# Patient Record
Sex: Male | Born: 1940 | Race: White | Hispanic: No | Smoking: Never smoker
Health system: Southern US, Community
[De-identification: ages and names within clinical notes are randomized; demographics above are authoritative.]

## PROBLEM LIST (undated history)

## (undated) DIAGNOSIS — C801 Malignant (primary) neoplasm, unspecified: Secondary | ICD-10-CM

## (undated) DIAGNOSIS — I25810 Atherosclerosis of coronary artery bypass graft(s) without angina pectoris: Secondary | ICD-10-CM

## (undated) DIAGNOSIS — E119 Type 2 diabetes mellitus without complications: Secondary | ICD-10-CM

## (undated) DIAGNOSIS — E7800 Pure hypercholesterolemia, unspecified: Secondary | ICD-10-CM

## (undated) DIAGNOSIS — I10 Essential (primary) hypertension: Secondary | ICD-10-CM

## (undated) HISTORY — PX: CHOLECYSTECTOMY: SHX55

## (undated) HISTORY — PX: COLON SURGERY: SHX602

## (undated) HISTORY — PX: CORONARY ARTERY BYPASS GRAFT: SHX141

## (undated) HISTORY — PX: VASECTOMY: SHX75

---

## 2001-03-26 ENCOUNTER — Ambulatory Visit (HOSPITAL_COMMUNITY): Admission: RE | Admit: 2001-03-26 | Discharge: 2001-03-27 | Payer: Self-pay | Admitting: Cardiology

## 2001-04-01 ENCOUNTER — Inpatient Hospital Stay (HOSPITAL_COMMUNITY): Admission: RE | Admit: 2001-04-01 | Discharge: 2001-04-07 | Payer: Self-pay | Admitting: Cardiothoracic Surgery

## 2001-04-01 ENCOUNTER — Encounter: Payer: Self-pay | Admitting: Cardiothoracic Surgery

## 2001-04-01 ENCOUNTER — Encounter (HOSPITAL_COMMUNITY): Admission: RE | Admit: 2001-04-01 | Discharge: 2001-05-13 | Payer: Self-pay | Admitting: Cardiology

## 2001-04-02 ENCOUNTER — Encounter: Payer: Self-pay | Admitting: Cardiothoracic Surgery

## 2001-04-03 ENCOUNTER — Encounter: Payer: Self-pay | Admitting: Cardiothoracic Surgery

## 2001-04-04 ENCOUNTER — Encounter: Payer: Self-pay | Admitting: Cardiothoracic Surgery

## 2001-05-15 ENCOUNTER — Encounter (HOSPITAL_COMMUNITY): Admission: RE | Admit: 2001-05-15 | Discharge: 2001-08-13 | Payer: Self-pay | Admitting: Cardiology

## 2003-08-02 ENCOUNTER — Encounter: Admission: RE | Admit: 2003-08-02 | Discharge: 2003-08-02 | Payer: Self-pay | Admitting: Family Medicine

## 2006-09-18 ENCOUNTER — Ambulatory Visit (HOSPITAL_COMMUNITY): Admission: RE | Admit: 2006-09-18 | Discharge: 2006-09-18 | Payer: Self-pay | Admitting: Orthopedic Surgery

## 2006-09-23 ENCOUNTER — Ambulatory Visit (HOSPITAL_BASED_OUTPATIENT_CLINIC_OR_DEPARTMENT_OTHER): Admission: RE | Admit: 2006-09-23 | Discharge: 2006-09-23 | Payer: Self-pay | Admitting: Orthopedic Surgery

## 2010-01-22 ENCOUNTER — Emergency Department (HOSPITAL_BASED_OUTPATIENT_CLINIC_OR_DEPARTMENT_OTHER): Admission: EM | Admit: 2010-01-22 | Discharge: 2010-01-22 | Payer: Self-pay | Admitting: Emergency Medicine

## 2010-12-08 ENCOUNTER — Emergency Department (INDEPENDENT_AMBULATORY_CARE_PROVIDER_SITE_OTHER): Payer: MEDICARE

## 2010-12-08 ENCOUNTER — Emergency Department (HOSPITAL_BASED_OUTPATIENT_CLINIC_OR_DEPARTMENT_OTHER)
Admission: EM | Admit: 2010-12-08 | Discharge: 2010-12-08 | Disposition: A | Discharge status: Home / Self Care | Payer: MEDICARE | Attending: Emergency Medicine | Admitting: Emergency Medicine

## 2010-12-08 DIAGNOSIS — Z951 Presence of aortocoronary bypass graft: Secondary | ICD-10-CM

## 2010-12-08 DIAGNOSIS — R071 Chest pain on breathing: Secondary | ICD-10-CM | POA: Insufficient documentation

## 2010-12-08 DIAGNOSIS — E669 Obesity, unspecified: Secondary | ICD-10-CM | POA: Insufficient documentation

## 2010-12-08 DIAGNOSIS — R0789 Other chest pain: Secondary | ICD-10-CM | POA: Insufficient documentation

## 2010-12-08 DIAGNOSIS — E119 Type 2 diabetes mellitus without complications: Secondary | ICD-10-CM | POA: Insufficient documentation

## 2010-12-08 DIAGNOSIS — E7800 Pure hypercholesterolemia, unspecified: Secondary | ICD-10-CM | POA: Insufficient documentation

## 2010-12-08 DIAGNOSIS — R079 Chest pain, unspecified: Secondary | ICD-10-CM

## 2010-12-08 DIAGNOSIS — I10 Essential (primary) hypertension: Secondary | ICD-10-CM | POA: Insufficient documentation

## 2010-12-08 DIAGNOSIS — Z79899 Other long term (current) drug therapy: Secondary | ICD-10-CM | POA: Insufficient documentation

## 2010-12-08 DIAGNOSIS — I2510 Atherosclerotic heart disease of native coronary artery without angina pectoris: Secondary | ICD-10-CM | POA: Insufficient documentation

## 2010-12-08 LAB — POCT CARDIAC MARKERS
CKMB, poc: <1 ng/mL — ABNORMAL LOW (ref 1.0–8.0)
Myoglobin, poc: 45.7 ng/mL (ref 12–200)

## 2011-01-05 NOTE — Op Note (Signed)
. [facility]  Patient:    Dwayne Brown, Dwayne Brown                       MRN: [id number] Proc. Date: 04/01/01 Adm. Date:  [id number] Attending:  Mikey Bussing CC:         Armanda Magic, M.D.   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  POSTOPERATIVE DIAGNOSIS:  Class 3 progressive angina with severe three-vessel coronary artery disease.  OPERATION PERFORMED:  Coronary artery bypass grafting x 5 (left internal mammary artery to left anterior descending, saphenous vein graft to diagonal, sequential saphenous vein graft to obtuse marginal 1 and obtuse marginal 2, saphenous vein graft to posterior descending coronary artery).  SURGEON:  Mikey Bussing, M.D.  ASSISTANT:  Maxwell Marion, RNFA  ANESTHESIA:  General.  INDICATIONS FOR PROCEDURE:  The patient is a 70 year old type 2 diabetic with obesity and hypertension who presented with new onset chest pain.  Cardiac catheterization by Dr. Mayford Knife demonstrated severe three-vessel coronary disease with total occlusion of the right coronary, 80% stenosis of the LAD diagonal and 80% stenosis of two circumflex marginals.  He was referred for surgical coronary revascularization.  Prior to the operation I examined the patient in his hospital room and reviewed the results of the cardiac catheterization with the patient and his wife.  I discussed the indications and the expected benefits of coronary artery bypass surgery with the patient and wife.  I discussed the major aspects of the operation including the choice of conduit, the location of the surgical incisions, use of general anesthesia and cardiopulmonary bypass and the expected hospital recovery. I reviewed the alternatives to surgical therapy for treatment of his coronary artery disease. I discussed with the patient and his wife the risks associated with the operation  including the risks of MI, CVA, bleeding, infection, and death.  He understood these implications for the operation and agreed to proceed with the procedure under what I felt was an informed consent.  OPERATIVE FINDINGS:  The patients body habitus made exposure of the aorta and back of the heart very difficult.  The ascending aorta was very foreshortened and there is a large amount of epicardial fat.  The coronaries are deeply imbedded in a deep layer of epicardial fat.  The LAD is intramyocardial in the interventricular septum as well.  The proximal right coronary was nongraftable.  The distal right coronary was small but graftable.  The patient would not be considered a candidate for redo surgery due to the body habitus and very poor exposure of the ascending aorta and the posterior aspect of the ventricle.  DESCRIPTION OF PROCEDURE:  The patient was brought to the operating room and placed supine on the operating table where general anesthesia was induced under invasive hemodynamic monitoring.  The chest, abdomen and legs were prepped with Betadine and draped as a sterile field.  A median sternotomy was performed as the saphenous vein was harvested from the right leg.  The internal mammary was harvested as a pedicle graft from its origin at the subclavian vessel and was a good vessel with excellent flow.  Heparin was administered and the ACT was documented as being therapeutic.  Through pursestrings placed in the ascending aorta and right atrium, the patient was cannulated and placed on bypass and cooled to 32 degrees.  The coronaries were identified and the mammary artery and vein grafts were  prepared for the distal anastomoses.  A cardioplegia cannula was placed in the ascending aorta and the patient was cooled to 28 degrees.  As the aortic crossclamp was applied, 650 cc of cold blood cardioplegia was delivered to the aortic root with immediate cardioplegic arrest and septal  temperature dropping to less than 12 degrees.  Topical iced saline slush was used to augment myocardial preservation and a pericardial insulator pad was used to protect the left phrenic nerve.  The distal coronary anastomoses were then performed.  The first distal anastomosis was to the posterior descending with total proximal occlusion. A reversed saphenous vein was sewn end-to-side with running 7-0 Prolene with a good flow through the graft.  The second distal anastomosis was to the first diagonal.  This had a proximal 60% stenosis.  A reversed saphenous vein was sewn end-to-side with 7-0 Prolene with good flow through the graft.  The third and fourth distal anastomoses consisted of a sequential vein to the OM1 and OM2.  Each was a 1.5 mm vessel with proximal 80% stenosis.  Each vessel was sclerotic and diffusely diseased with a diabetic pattern.  A side-to-side anastomosis with the vein to the OM1 was constructed with a running 7-0 Prolene.  An end-to-side anastomosis between the end of the vein and OM2 was constructed with a running 7-0 Prolene.  There was excellent flow through the graft and cardioplegia was redosed following completion of these two distal anastomoses.  The fifth distal anastomosis was to the distal third of the LAD where it became epicardial in location.  The left internal mammary artery pedicle was brought through an opening created in the left lateral pericardium and was brought down onto the LAD and was sewn end-to-side with running  8-0 Prolene.  There was good flow through the anastomosis with immediate rise in septal temperature  after release of the pedicle clamp on the mammary artery. The mammary pedicle was secured to the epicardium and the aortic crossclamp was removed.  The heart resumed a spontaneous rhythm.  Using a partial occlusion clamp and 4.0 mm punch, three proximal vein anastomoses were placed on the ascending aorta.  The partial clamp was  removed and the vein grafts were perfused.  Each had good flow and hemostasis was documented at the proximal and distal sites.  The patient was rewarmed to 37 degrees.  Temporary pacing wires  were applied. The patient was then weaned from bypass after the lungs re-expanded.  The ventilator was resumed.  The patient remained hemodynamically stable with good cardiac output and stable blood pressure.  Protamine was administered and the cannulas were removed.  The mediastinum was irrigated with warm antibiotic irrigation.  The leg incision was irrigated and closed in a standard fashion. The pericardium was loosely reapproximated over the aorta and vein grafts. Two mediastinal and a left pleural chest tube were placed and brought out through separate incisions.  The sternum was reapproximated with interrupted steel wire.  The pectoralis fascia was closed with interrupted #1 Vicryl.  The subcutaneous and skin were closed with running Vicryl and a sterile dressing was applied.  Total cardiopulmonary bypass time was 160 minutes with aortic crossclamp time of 70 minutes. DD:  04/01/01 TD:  04/01/01 Job: [id number]

## 2011-01-05 NOTE — Consult Note (Signed)
[place]. [facility]  Patient:    Dwayne Brown, Dwayne Brown                       MRN: [id number] Proc. Date: 03/26/01 Adm. Date:  [id number] Attending:  Armanda Magic CC:         [facility]  Marinda Elk, M.D.   Consultation Report  REASON FOR CONSULTATION:  Severe coronary artery disease with Class III angina.  PHYSICAL REQUESTING CONSULTATION:  Dr. Armanda Magic.  PRIMARY CARE PHYSICIAN:  Dr. Marinda Elk.  CHIEF COMPLAINT:  Chest pain.  HISTORY OF PRESENT ILLNESS:  I was asked to see Mr. Dwayne Brown in consultation during this hospitalization by Dr. Mayford Knife for evaluation of recently diagnosed severe coronary artery disease.  The patient is a 70 year old, obese, male diabetic with a history of hypertension and hyperlipidemia who had experienced exertional chest pain for the past two to three weeks.  This occurs in the epigastric area and radiates to the left upper chest and left shoulder.  The pain resolves with rest and has been repeated during exertion over the recent few weeks.  The patient denies any rest angina or associated diaphoresis, nausea, vomiting or jaw pain.  There is no syncope.  The patient was referred to Dr. Mayford Knife for evaluation of this chest pain and was examined by Dr. Mayford Knife on August 6.  He was scheduled for outpatient cardiac catheterization today which was completed by Dr. Mayford Knife and revealed normal LV function with left ventricular end-diastolic pressure of 12 mmHg.  The LAD had a mid 50% stenosis.  The first and second obtuse marginal vessels had a 70-80% stenosis.  The right coronary had a chronic occlusion with reconstitution of the distal right via collaterals from the left.  The patient was not felt to be a good candidate for angioplasty due to his long diabetic lesions and he was referred for surgical coronary revascularization.  The patient is currently in the catheterization lab holding unit and comfortable without  chest pain.  PAST MEDICAL HISTORY: 1. Hypertension. 2. Type 2 diabetes mellitus. 3. Gout. 4. Hyperlipidemia.  PAST SURGICAL HISTORY: 1. Cholecystectomy. 2. Vasectomy.  MEDICATIONS: 1. Amaryl 1 mg a day. 2. Univasc 15 mg q.d. 3. Allopurinol 300 mg a day. 4. Lopid 600 mg b.i.d. 5. Glucophage 850 mg b.i.d. 6. Avandia 4 mg a day. 7. Indocin p.r.n. gout pain.  ALLERGIES:  No known drug allergies.  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  He use to smoke, but stopped in 1965.  He occasionally uses alcohol, but this is minimal.  FAMILY HISTORY:  The patients father died at age 3 of heart failure.  His mother has diabetes at age 4.  He has one brother, age 94, who had coronary artery bypass graft surgery in his 67s.  REVIEW OF SYSTEMS:  The patients current weight of 270 pounds is stable.  He is right hand dominant.  He denies any thoracic trauma or rib fractures in the past.  He denies any bleeding diaphysis or easy bruisability.  He denies any other circulatory problems including TIA, CVA, DVT, phlebitis or claudication. He denies any problems with prior general anesthesia.  He has had no problems with hepatitis, jaundice, ulcer disease or blood per rectum.  He denies any history of prostatitis, nocturia, hematuria or urinary infection.  He denies any pulmonary problems to shortness of breath, hemoptysis, bronchitis or asthma.  Review of  systems is otherwise negative.  PHYSICAL EXAMINATION:  VITAL SIGNS:  Height 5 feet 10 inches, weight 270 pounds.  Blood pressure 110/70, heart rate 60 per minute, respiratory rate 18 and unlabored.  GENERAL:  This is a pleasant, obese, middle-aged, white male in the catheterization holding unit in no distress.  HEENT:  Normocephalic.  EOMs normal.  Pink conjunctivae.  Pharynx is clear. Dentition under adequate repair.  NECK:  Supple without JVD, thyromegaly, carotid bruit or  mass.  LYMPHATICS:  Reveal no palpable adenopathy in the cervical, supraclavicular or axillary regions.  Thoracic exam was without deformity.  LUNGS:  Clear to auscultation bilaterally.  CARDIAC:  Regular rate and rhythm without S3 gallop or murmur.  EXTREMITIES:  Good range of motion without swollen tender joints and without clubbing.  There is no significant pedal edema.  ABDOMEN:  Soft, nontender without pulsatile mass or abdominal bruit.  His right groin has a compression dressing following cardiac catheterization.  RECTAL/GU:  Deferred.  SKIN:  Clear, warm and dry without lesion or rash.  EXTREMITIES:  2+ pulses in the radial, femoral and pedal areas bilaterally. There is no venous insufficiency of the lower extremities.  NEUROLOGIC:  Alert and oriented x 3 with full motor function.  LABORATORY DATA AND X-RAY FINDINGS:  I reviewed the coronary arteriograms and agree with Dr. Malachy Mood impression and recommendation for surgical revascularization.  His vascular lab studies show no significant carotid artery disease.  His brachial pressures are equal bilaterally and the palmar arch is incomplete in both upper extremities.  ABIs are greater than 1.0 in both lower extremities.  His precatheterization chest x-ray shows no active disease.  His 12-lead EKG shows sinus bradycardia, otherwise normal.  His precatheterization creatinine was normal at 1.2.  His hematocrit is 34, platelet count 228,000.  His slight precatheterization anemia is unexplained.  IMPRESSION/PLAN:  Surgical coronary vascularization would benefit this obese diabetes with respect to preservation of his ventricular function and improved symptoms and survival.  I discussed the procedure with the patient and his wife including the major aspects of the surgical procedure including location  of the incisions, use of general anesthesia and cardiopulmonary bypass, choice of conduit and the expected hospital recovery.  I  reviewed the alternatives to surgical therapy for his coronary artery disease.  We discussed the risks associated with this operation to the patient including risks of MI, CVA, bleeding, infection and death.  He understands these implications for the surgery and agrees to proceed with the operation.  We will schedule the procedure on August 13, for early a.m.DD:  03/26/01 TD:  03/26/01 Job: [id number]

## 2011-01-05 NOTE — Op Note (Signed)
NAMEKENON, DELASHMIT              ACCOUNT NO.:  [id number]   MEDICAL RECORD NO.:  [id number]          PATIENT TYPE:  AMB   LOCATION:  DSC                          FACILITY:  [facility]   PHYSICIAN:  Harvie Junior, M.D.   DATE OF BIRTH:  11/30/1940   DATE OF PROCEDURE:  09/23/2006  DATE OF DISCHARGE:                               OPERATIVE REPORT   PREOPERATIVE DIAGNOSIS:  Medial meniscal tear.   POSTOPERATIVE DIAGNOSES:  1. 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  2. Medial shelf plica.  3. Chondromalacia of the patellofemoral trochlea.  4. The patient has a subacromial impingement, left side.   PROCEDURES PERFORMED:  1. Partial posterior horn medial meniscectomy with corresponding      debridement in the medial compartment.  2. Debridement of patellofemoral trochlea by way of chondroplasty.  3. Debridement of medial shelf plica.  4. Subacromial injection with 6 mL of 0.5% Marcaine with 2 mL of 80 mg      per mL of Depo-Medrol.   SURGEON:  Harvie Junior, M.D.   Threasa HeadsOrma Flaming.   ANESTHESIA:  General.   INDICATIONS FOR PROCEDURE:  Mr. Skelley is a 70 year old male with a  long history of having had effusion and pain in the knee.  He had a  history of gout.  He was treated presumptively with this diagnosis and  really did not get much better.  Because of continued complaints of  pain, he was ultimately evaluated in the office.  I felt him to have a  medial meniscal tear; MRI confirmed the diagnosis.  We had a long talk  about treatment options and ultimately felt that arthroscopic  intervention would be the most appropriate course of action.  He was  brought to the operating room for the procedure.  The patient began  having significant pain in the left shoulder 3 days prior to surgery.  He was having difficulty raising the arm up over head.  Examination  showed a good external rotational strength.  He had significant  primary  and secondary impingement findings, no significant tenderness to  palpation over the [facility] joint.  We talked about the possibility of  injection perioperatively.  He did wish to proceed with this, and the  left shoulder was injected subacromially with 6 of Marcaine and 2 mL of  Depo-Medrol to give him good postoperative relief in the shoulder.  A  Band-Aid was placed once this was accomplished.   DESCRIPTION OF PROCEDURE:  The patient was brought to the operating  room.  After adequate general anesthesia was obtained with a general  anesthetic, the patient was placed on the operating table, where the  left leg was prepped and draped in the usual sterile fashion.  Following  this, routine arthroscopic examination of the knee revealed that there  was obvious posterior horn medial meniscal tear.  This was debrided with  a straight-biting forceps and up-biting forceps and the meniscal rim was  counted down with the suction shaver to a smooth and stable rim.  There  was  a free flap which was flapped posteriorly and medially, and would  flap in and out of the knee.  There also was a flap medially, which was  under the medial border of the meniscus.  Once this was completely  debrided, the remaining meniscal rim was stable and a probe could be  used to ensure that there was still 4 mm of meniscal rim posteriorly.  Attention was then turned to the medial femoral condyle.  It had some  grade 2 changes, no deep fragments or cracks.  Attention was then turned  to the ACL, normal, lateral side normal.  Attention was then turned up  to the patellofemoral joint, which had the significant chondromalacia in  the patellofemoral trochlea, which was debrided.  This was debrided from  both the medial and lateral compartments by way of chondroplasty.  Attention was turned down to that medial compartment and the large  medial shelf plica, which was blocking this initially, was now debrided  with a  suction shaver back to the smooth and stable rim.  Once this was  completed, 6 liters of normal saline irrigation was instilled in the  knee to wash the knee out thoroughly, and at this point, the knee was  copiously irrigated and suctioned dry.  The arthroscopic portals were  closed with a bandage.  A sterile compressive dressing was applied, and  the patient taken to the recovery room where he was noted to be in  satisfactory condition.   ESTIMATED BLOOD LOSS:  None.      Harvie Junior, M.D.  Electronically Signed     JLG/MEDQ  D:  09/23/2006  T:  09/24/2006  Job:  [id number]   cc:   Harvie Junior, M.D.

## 2011-01-05 NOTE — Cardiovascular Report (Signed)
[place]. [facility]  Patient:    Dwayne Brown, Dwayne Brown                       MRN: [id number] Proc. Date: 03/26/01 Adm. Date:  [id number] Attending:  Armanda Magic CC:         Marinda Elk, M.D.   Cardiac Catheterization  REFERRING PHYSICIAN:  Marinda Elk, M.D.  PROCEDURE:  Left heart catheterization, coronary angiography, and left ventriculography.  OPERATOR:  Armanda Magic, M.D.  INDICATIONS:   Chest pain.  IV ACCESS:  Via right femoral artery, 6-French sheath.  COMPLICATIONS:  None.  HISTORY:  The patient is a 70 year old white male with a history of hyperlipidemia, hypertension, non-insulin-dependent diabetes mellitus who presented with chest pain.  He has a strong family history of coronary disease, and he now presents for cardiac catheterization.  DESCRIPTION OF PROCEDURE:  The patient is brought to the cardiac catheterization laboratory in a fasting nonsedated state.  Informed consent was obtained.  The patient was connected to continuous heart rate and pulse oximetry monitoring and intermittent blood pressure monitoring.  The right groin was prepped and draped in a sterile fashion.  Xylocaine, 1%, was used for local anesthesia.  Using the modified Seldinger technique, a 6-French sheath was placed in the right femoral artery.  Under fluoroscopic guidance, a 6-French JL4 catheter was placed in the left coronary artery.  Multiple cine films were taken in the RAO 30-degree, LAO 40-degree views.  This catheter was then exchanged out over a guidewire for a 6-French JR4 catheter which was placed under fluoroscopic guidance into the right coronary artery.  Multiple cine films were taken in the 30-degree RAO, 40-degree LAO views.  This catheter was then exchanged out over a guidewire for a 6-French angled pigtail catheter which was placed in the left ventricular cavity.  Left ventriculography was performed in the 30-degree RAO view using a total of 30 cc  of contrast at 13 cc per second.  The catheter was then removed over a guidewire.  At the end of the procedure, all catheters and sheaths were removed.  Manual compression was performed until adequate hemostasis was obtained.  The patient was transferred back to the room in stable condition.  RESULTS: 1. The left main coronary artery is widely patent throughout the course and    bifurcates into a left anterior descending artery, a very small ramus    branch, and a left circumflex artery. 2. The left anterior descending artery is heavily calcified proximally with    luminal irregularities.  It gives off a first diagonal branch which has a    30-40% ostial narrowing.  The mid LAD has a 40-50% narrowing and then is    widely patent throughout the rest of its course giving off a second    diagonal which is patent. 3. The ramus is a very small branch but is patent. 4. The left circumflex has a 30% ostial lesion and then is patent throughout    its course.  It gives rise to two obtuse marginal branches sequentially    next to each other.  The first obtuse marginal branch has a 60-70%    narrowing proximally and then bifurcates distally into two daughter    branches.  The second obtuse marginal has a long narrowing at least 60-70%    narrowing and then branches into two daughter branches.  There is    left-to-left filling from the left circumflex system to the distal  right    coronary artery. 5. The right coronary artery is diffusely diseased up to 80% throughout the    proximal and mid portion of the body and then occluded distally.  There is    a very large RV marginal branch that is widely patent.  Left ventriculography performed in the 30-degree RAO view using a total of 30 cc of contrast at 13 cc per second showed normal left ventricular function. Left ventricular pressure was 107/12 mmHg.  Aortic pressure was 104/67 mmHg.  ASSESSMENT: 1. Two-vessel obstructive coronary disease. 2.  Normal left ventricular function. 3. Diabetes mellitus. 4. Hyperlipidemia.  PLAN:  CVTS consult. DD:  03/26/01 TD:  03/26/01 Job: [id number]

## 2011-01-05 NOTE — Discharge Summary (Signed)
[place]  Patient:    YUSUF, YU Visit Number: [id number] MRN: [id number]          Service Type: SUR Location: 2000 2005 01 Attending Physician:  Mikey Bussing Dictated by:   Maxwell Marion, RNFA Adm. Date:  04/01/2001 Disc. Date: 04/07/2001   CC:         Marinda Elk, M.D.  Armanda Magic, M.D.   Discharge Summary  DATE OF BIRTH:  07/31/1941  DATE OF SURGERY:  April 01, 2001  ADMISSION DIAGNOSIS:  Three-vessel coronary artery disease and class III progressive angina.  PAST MEDICAL HISTORY: 1. Hypertension. 2. Diabetes mellitus type 2. 3. Gout. 4. Hypercholesterolemia.  PAST SURGICAL HISTORY: 1. Cholecystectomy. 2. Vasectomy.  ALLERGIES:  No known drug allergies.  DISCHARGE DIAGNOSES: 1. Three-vessel coronary artery disease and class III angina, status post    coronary artery bypass grafting. 2. Postoperative anemia, resolving.  HISTORY OF PRESENT ILLNESS:  Mr. Dwayne Brown is a 70 year old white man who presented initially with a two- to three-week history of exertional chest pain.  His pain was epigastric in location and radiated to his left chest and shoulder.  It was relieved with rest.  No nausea, vomiting, diaphoresis associated with the pain.  She saw Dr. Mayford Knife in her office on March 25, 2001, and she recommended cardiac catheterization, which was performed on March 26, 2001.  This revealed normal LV function and two-vessel coronary artery disease.  Because of his coronary anatomy, he was not a candidate for angioplasty.  Cardiac surgery consult was obtained with Dr. Kathlee Nations Trigt. After examination of the patient and review of the records, he recommended coronary artery bypass grafting as preferred treatment choice for this gentleman.  This was scheduled for April 01, 2001.  Doppler studies were also performed, which revealed no significant coronary artery disease, and he was noted to have palpable  pedal pulses bilaterally.  Mr. Hammerschmidt left the hospital on a leave of absence until the morning of his surgery.  HOSPITAL COURSE:  On April 01, 2001, Mr. Aird was electively admitted to [facility] by Dr. Kathlee Nations Trigt and underwent uncomplicated coronary artery bypass grafting x 5.  The grafts placed at the time of procedure were left internal mammary artery to the left anterior descending artery, saphenous vein was grafted to the diagonal artery, saphenous vein was grafted to the posterior descending artery, saphenous vein was grafted in a sequential fashion to the first obtuse marginal and the second obtuse marginal.  A vein was harvested from the right leg for bypass graft.  At the conclusion of the procedure he was transferred in stable condition to the SICU.  Mr. Rybicki postoperative course has been notable for anemia, requiring transfusion.  On April 03, 2001, his hemoglobin was 6.9, hematocrit was 19.6.  He received two units of packed red blood cells.  His anemia is resolving.  Repeat hemoglobin and hematocrit studies on April 04, 2001, revealed hemoglobin 8.2, hematocrit 23.5.  Again, on April 06, 2001, hemoglobin 8.5 and hematocrit 24.8.  He was also started on oral iron supplementation.  Mr. Bembenek is making very good progress with recovery from his surgery, and he was discharged home this morning, April 07, 2001.  CONDITION ON DISCHARGE:  Improved.  DISCHARGE INSTRUCTIONS:  Include medications, activity, diet, wound care, and follow-up appointments.  Please see discharge instruction sheet for details.  DISCHARGE MEDICATIONS: 1. Enteric-coated aspirin 325 mg p.o. q.d. 2. Tylox 1-2 p.o. q.4-6h.  p.r.n. for pain. 3. Toprol XL 25 mg p.o. q.d. 4. Niferex 150 mg p.o. q.d. 5. Colace 200 mg p.o. q.d. 6. Lasix 40 mg p.o. q.d. x 1 week. 7. Potassium chloride 20 mEq p.o. q.d. x 1 week. 8. He has also been instructed to resume his home medications of:    a.  Avandia 4 mg p.o. q.d.    b. Amaryl 0.5 mg p.o. q.d.    c. Glucophage 850 mg p.o. q.d.    d. Allopurinol 300 mg p.o. q.d.  FOLLOW-UP:  Mr. Maulden has an appointment at the [facility] office on Friday, April 11, 2001, at 10 a.m. for staple removal.  He also has an appointment to see Dr. Mayford Knife at her office on Tuesday, April 22, 2001.  He will have a chest x-ray taken at 10:45 and see Dr. Mayford Knife at 11:30.  His last appointment is with Dr. Donata Clay on Friday, April 25, 2001, at 10:30 in the morning at the [facility] office. Dictated by:   Maxwell Marion, RNFA Attending Physician:  Mikey Bussing DD:  04/07/01 TD:  04/08/01 Job: [phone] [id number]

## 2011-10-12 ENCOUNTER — Emergency Department (INDEPENDENT_AMBULATORY_CARE_PROVIDER_SITE_OTHER): Payer: Medicare Other

## 2011-10-12 ENCOUNTER — Emergency Department (HOSPITAL_BASED_OUTPATIENT_CLINIC_OR_DEPARTMENT_OTHER)
Admission: EM | Admit: 2011-10-12 | Discharge: 2011-10-12 | Disposition: A | Discharge status: Home / Self Care | Payer: Medicare Other | Attending: Emergency Medicine | Admitting: Emergency Medicine

## 2011-10-12 ENCOUNTER — Other Ambulatory Visit: Payer: Self-pay

## 2011-10-12 ENCOUNTER — Encounter (HOSPITAL_BASED_OUTPATIENT_CLINIC_OR_DEPARTMENT_OTHER): Payer: Self-pay

## 2011-10-12 DIAGNOSIS — Z951 Presence of aortocoronary bypass graft: Secondary | ICD-10-CM | POA: Insufficient documentation

## 2011-10-12 DIAGNOSIS — I2510 Atherosclerotic heart disease of native coronary artery without angina pectoris: Secondary | ICD-10-CM | POA: Insufficient documentation

## 2011-10-12 DIAGNOSIS — R1013 Epigastric pain: Secondary | ICD-10-CM | POA: Insufficient documentation

## 2011-10-12 DIAGNOSIS — I517 Cardiomegaly: Secondary | ICD-10-CM

## 2011-10-12 DIAGNOSIS — Z7982 Long term (current) use of aspirin: Secondary | ICD-10-CM | POA: Insufficient documentation

## 2011-10-12 DIAGNOSIS — R0789 Other chest pain: Secondary | ICD-10-CM

## 2011-10-12 DIAGNOSIS — I459 Conduction disorder, unspecified: Secondary | ICD-10-CM | POA: Insufficient documentation

## 2011-10-12 DIAGNOSIS — R079 Chest pain, unspecified: Secondary | ICD-10-CM

## 2011-10-12 DIAGNOSIS — I4510 Unspecified right bundle-branch block: Secondary | ICD-10-CM | POA: Insufficient documentation

## 2011-10-12 DIAGNOSIS — Z9889 Other specified postprocedural states: Secondary | ICD-10-CM | POA: Insufficient documentation

## 2011-10-12 HISTORY — DX: Pure hypercholesterolemia, unspecified: E78.00

## 2011-10-12 LAB — DIFFERENTIAL
Basophils Relative: 1 % (ref 0–1)
Eosinophils Absolute: 0.1 10*3/uL (ref 0.0–0.7)
Monocytes Relative: 8 % (ref 3–12)
Neutrophils Relative %: 51 % (ref 43–77)

## 2011-10-12 LAB — BASIC METABOLIC PANEL
BUN: 17 mg/dL (ref 6–23)
Creatinine, Ser: 1.2 mg/dL (ref 0.50–1.35)
GFR calc Af Amer: 69 mL/min — ABNORMAL LOW (ref >90)
GFR calc non Af Amer: 60 mL/min — ABNORMAL LOW (ref >90)
Potassium: 4.3 mEq/L (ref 3.5–5.1)

## 2011-10-12 LAB — CBC
Hemoglobin: 13.3 g/dL (ref 13.0–17.0)
MCH: 31.5 pg (ref 26.0–34.0)
MCHC: 33.9 g/dL (ref 30.0–36.0)
RDW: 14.1 % (ref 11.5–15.5)

## 2011-10-12 NOTE — ED Notes (Signed)
C/o mid chest/epigastric pain-intermittent-advil relieves pain-was sent from [place] in [place] with EKG

## 2011-10-12 NOTE — ED Provider Notes (Addendum)
History     CSN: [id number]  Arrival date & time 10/12/11  1404   First MD Initiated Contact with Patient 10/12/11 1421      Chief Complaint  Patient presents with  . Chest Pain    (Consider location/radiation/quality/duration/timing/severity/associated sxs/prior treatment) Patient is a 71 y.o. male presenting with chest pain. The history is provided by the patient.  Chest Pain The chest pain began 5 - 7 days ago (Patient states the pain is in the lower sternum and epigastric region). Duration of episode(s) is 1 hour. Chest pain occurs intermittently. The chest pain is resolved. Associated with: Nothing. At its most intense, the pain is at 5/10. The pain is currently at 0/10. The severity of the pain is moderate. The quality of the pain is described as pressure-like. The pain does not radiate. Exacerbated by: Is not worsened by breathing, eating, exertion or positions. Primary symptoms include abdominal pain. Pertinent negatives for primary symptoms include no fever, no shortness of breath, no cough, no wheezing, no palpitations, no nausea and no vomiting.  Pertinent negatives for associated symptoms include no diaphoresis, no lower extremity edema, no numbness and no weakness. He tried NSAIDs (Pain relieved with Advil) for the symptoms. Risk factors include being elderly and male gender.  His past medical history is significant for CAD, diabetes, hyperlipidemia and hypertension.  Pertinent negatives for past medical history include no DVT.     Past Medical History  Diagnosis Date  . Hypertension   . High cholesterol     Past Surgical History  Procedure Date  . Cholecystectomy   . Coronary artery bypass graft   . Vasectomy     No family history on file.  History  Substance Use Topics  . Smoking status: Never Smoker   . Smokeless tobacco: Not on file  . Alcohol Use: Yes      Review of Systems  Constitutional: Negative for fever and diaphoresis.  Respiratory: Negative  for cough, shortness of breath and wheezing.   Cardiovascular: Positive for chest pain. Negative for palpitations.  Gastrointestinal: Positive for abdominal pain. Negative for nausea and vomiting.  Neurological: Negative for weakness and numbness.  All other systems reviewed and are negative.    Allergies  Review of patient's allergies indicates no known allergies.  Home Medications  No current outpatient prescriptions on file.  BP 213/73  Pulse 65  Temp(Src) 98.2 F (36.8 C) (Oral)  Resp 20  Ht 5\' 11"  (1.803 m)  Wt 265 lb (120.203 kg)  BMI 36.96 kg/m2  SpO2 100%  Physical Exam  Nursing note and vitals reviewed. Constitutional: He is oriented to person, place, and time. He appears well-developed and well-nourished. No distress.  HENT:  Head: Normocephalic and atraumatic.  Mouth/Throat: Oropharynx is clear and moist.  Eyes: Conjunctivae and EOM are normal. Pupils are equal, round, and reactive to light.  Neck: Normal range of motion. Neck supple.  Cardiovascular: Normal rate, regular rhythm and intact distal pulses.   No murmur heard. Pulmonary/Chest: Effort normal and breath sounds normal. No respiratory distress. He has no wheezes. He has no rales. He exhibits no tenderness.  Abdominal: Soft. He exhibits no distension. There is no tenderness. There is no rebound and no guarding.  Musculoskeletal: Normal range of motion. He exhibits no edema and no tenderness.  Neurological: He is alert and oriented to person, place, and time.  Skin: Skin is warm and dry. No rash noted. No erythema.  Psychiatric: He has a normal mood and affect. His  behavior is normal.    ED Course  Procedures (including critical care time)  Labs Reviewed  BASIC METABOLIC PANEL - Abnormal; Notable for the following:    Glucose, Bld 105 (*)    GFR calc non Af Amer 60 (*)    GFR calc Af Amer 69 (*)    All other components within normal limits  CBC  DIFFERENTIAL  CARDIAC PANEL(CRET  KIN+CKTOT+MB+TROPI)   Dg Chest 2 View  10/12/2011  *RADIOLOGY REPORT*  Clinical Data: Chest pain  CHEST - 2 VIEW  Comparison: Chest x-ray of 12/08/2010  Findings: The lungs are clear.  Cardiomegaly is stable.  Median sternotomy sutures are noted from prior CABG.  IMPRESSION: Stable cardiomegaly.  No active lung disease.  Original Report Authenticated By: Juline Patch, M.D.     Date: 10/12/2011  Rate: 60  Rhythm: normal sinus rhythm  QRS Axis: normal  Intervals: normal  ST/T Wave abnormalities: nonspecific ST changes  Conduction Disutrbances:nonspecific intraventricular conduction delay  Narrative Interpretation:   Old EKG Reviewed: unchanged    1. Chest pain, atypical       MDM   Patient with intermittent chest pain for the last 5-6 days. He states he takes ibuprofen and the pain goes away. Area where he is pointing right at the pain is located in his lower sternum and epigastric region. He has no shortness of breath, nausea, vomiting, fever, cough diaphoresis with this pain. He states he had a CABG in the past and at that time he was having chest pain but it doesn't feel anything like this pain. States pain seems to be worse in the evenings and sometimes when he wakes up in the middle of the night.  Symptoms not consistent with PE. They are nonexertional and seemed less likely to be cardiac in origin however given his past medical history CBC, BMP, cardiac enzymes, chest x-ray pending. EKG unchanged with an incomplete right bundle branch block. Patient is well-appearing in chest pain free here. TIMI 2 for known coronary disease and aspirin daily. He denies having any pain today.  No infectious symptoms and states he does have GERD but this does not feel like reflux.   Symptoms are unlikely to be cardiac in origin based on history and negative cardiac enzymes, EKG, CBC, BMP and chest x-ray. however due to his past medical history spoke with Armanda Magic patient will followup as an  outpatient in the office for further evaluation. Patient has not had pain in the last 12 hours and feel that he would have elevated enzymes if there was a cardiac event last night.  Findings and plan discussed with patient and he is agreeable.  Gwyneth Sprout, MD 10/12/11 1527  Gwyneth Sprout, MD 10/12/11 1539

## 2011-10-12 NOTE — Discharge Instructions (Signed)
Chest Pain (Nonspecific) Chest pain has many causes. Your pain could be caused by something serious, such as a heart attack or a blood clot in the lungs. It could also be caused by something less serious, such as a chest bruise or a virus. Follow up with your doctor. More lab tests or other studies may be needed to find the cause of your pain. Most of the time, nonspecific chest pain will improve within 2 to 3 days of rest and mild pain medicine. HOME CARE  For chest bruises, you may put ice on the sore area for 15 to 20 minutes, 3 to 4 times a day. Do this only if it makes you or your child feel better.   Put ice in a plastic bag.   Place a towel between the skin and the bag.   Rest for the next 2 to 3 days.   Go back to work if the pain improves.   See your doctor if the pain lasts longer than 1 to 2 weeks.   Only take medicine as told by your doctor.   Quit smoking if you smoke.  GET HELP RIGHT AWAY IF:   There is more pain or pain that spreads to the arm, neck, jaw, back, or belly (abdomen).   You or your child has shortness of breath.   You or your child coughs more than usual or coughs up blood.   You or your child has very bad back or belly pain, feels sick to his or her stomach (nauseous), or throws up (vomits).   You or your child has very bad weakness.   You or your child passes out (faints).   You or your child has a temperature by mouth above 102 F (38.9 C), not controlled by medicine.  MAKE SURE YOU:   Understand these instructions.   Will watch this condition.   Will get help right away if you or your child is not doing well or gets worse.  Document Released: 01/23/2008 Document Revised: 04/18/2011 Document Reviewed: 01/23/2008 [facility] Patient Information 2012 [place], [place].Cardiac Biomarkers Cardiac biomarkers are enzymes, proteins, and hormones that are associated with heart function, damage or failure. Some of the tests are specific for the heart while  others are also elevated with skeletal muscle damage. Cardiac biomarkers are used for diagnostic and prognostic purposes and are frequently ordered by caregivers when someone comes into the Emergency Room complaining of symptoms, such as chest pain, pressure, nausea, and shortness of breath. These tests are ordered, along with other laboratory and non-laboratory tests, to detect heart failure (which is often a chronic, progressive condition affecting the ability of the heart to fill with blood and pump efficiently) and the acute coronary syndromes (ACS) as well as to help determine prognosis for people who have had a heart attack. ACS is a group of symptoms that reflect a sudden decrease in the amount of blood and oxygen, also termed 'ischemia,' reaching the heart. This decrease is frequently due to either a narrowing of the coronary arteries (atherosclerosis or vessel spasm) or unstable plaques, which can cause a blood clot (thrombus) and blockage of blood flow. If the oxygen supply is low, it can cause angina (pain); if blood flow is reduced, it can cause death of heart cells (called myocardial infarction or heart attack) and can lead to death of the affected heart muscle cells and to permanent damage and scarring of the heart.  The goal with cardiac biomarkers is to be able to  detect the presence and severity of an acute heart condition as soon as possible so that appropriate treatment can be initiated.  There are only a few cardiac biomarkers that are being routinely used by physicians. Some have been phased out because they are not as specific as the marker of choice - troponin. Many other potential cardiac biomarkers are still being researched but their clinical utility has yet to be established.  Note: Cardiac biomarkers are not the same tests as those that are used to screen the general healthy population for their risk of developing heart disease. Those can be found under Cardiac Risk  Assessment. LABORATORY TESTS CURRENT CARDIAC BIOMARKERS   CK and CK-MB   BNP or (NT-proBNP)   Troponin   Myoglobin (not always used; sometimes ordered with troponin)  MORE GENERAL TESTS FREQUENTLY ORDERED ALONG WITH CARDIAC BIOMARKERS   Blood Gases   CMP   BMP   Electrolytes   CBC  ON THE HORIZON Ischemia modified albumin (IMA) - Test has received FDA approval for use with troponin and electrocardiogram to rule out acute coronary syndrome (ACS) in patients with chest pain. May become useful for identifying patients at higher risk of heart attack and potentially could replace myoglobin one day.  NON-LABORATORY TESTS These tests allow caregivers to look at the size, shape, and function of the heart as it is beating. They can be used to detect changes to the rhythm of the heart as well as to detect and evaluate damaged tissues and blocked arteries.   EKG (ECG, electrocardiogram)   Coronary angiography (or arteriography)   Stress testing   Nuclear scan   ECG (echocardiogram)   Chest X-ray  THE FOLLOWING SUMMARIZES CURRENTLY USED CARDIAC BIOMARKERS. Marker: CK  What: Enzyme that exists in three different isoforms   Where Found: Heart, brain, and skeletal muscle   What Indicates: Injury to muscle cells   Time to Increase: 4 to 6 hours after injury, peaks in 18 to 24 hours   Time back to Normal: Normal in 48 to 72 hours, unless due to continuing injury   When/How Used: Being phased out, may be ordered prior to CK-MB  Marker: CK-MB  What: Heart- related portion of total CK enzyme   Where Found: Heart primarily, but also in skeletal muscle   What Indicates: Injury (cell death) to heart   Time to Increase: 4 to 6 hrs after heart attack, peaks in 12 to 20 hours   Time back to Normal: Returns to normal in 24 to 48 hours unless new/continual damage   When/How Used: Not as specific as Troponin for heart injury/attack, may be ordered when Troponin is not available, may  be ordered to monitor new/continuing damage  Marker: Myoglobin  What: Small oxygen-storing protein   Where Found: Heart and other muscle cells   What Indicates: Injury to heart or other muscle cells. Also elevated with kidney problems.   Time to Increase: Starts to rise within 2 to 3 hours, peaks in 8 to 12 hours.   Time back to Normal: Falls back to normal by about one day after injury occurred   When/How Used: Ordered along with Troponin, helps diagnose heart injury/attack  Marker: Cardiac Troponin  What: Components of a Regulatory protein complex. Two cardiac specific isoforms: T and I   Where Found: Heart muscle   What Indicates: Heart injury/damage   Time to Increase: 4 to 8 hours   Time back to Normal: Remains elevated for 7 to 14 days  When/How Used: Ordered to help assess prognosis and diagnose heart attack  Marker: LDH  What: Enzyme   Where Found: Almost all body tissues   What Indicates: General marker of injury to cells   When/How Used: Phased out, not specific  Marker: AST  What: Enzyme   Where Found: Almost all body tissues   What Indicates: General marker of injury to cells   When/How Used: Phased out, not specific  Marker: Hs-CRP  What: Protein   Where Found: Associated with athero-sclerosis   What Indicates: Inflammatory process   Time back to Normal: Elevated with inflammation   When/How Used: May help determine prognosis of patients who have had heart attack  Marker: BNP  What: Hormone   Where Found: Heart's left ventricle   What Indicates: Heart failure   Time back to Normal: Elevation related to severity   When/How Used: Help diagnose and evaluate heart failure, prognosis, and to monitor therapy  Document Released: 08/29/2004 Document Revised: 04/18/2011 Document Reviewed: 05/16/2005 [facility] Patient Information 2012 [place], [place].

## 2011-12-19 ENCOUNTER — Encounter (HOSPITAL_BASED_OUTPATIENT_CLINIC_OR_DEPARTMENT_OTHER): Payer: Self-pay | Admitting: *Deleted

## 2011-12-19 ENCOUNTER — Emergency Department (HOSPITAL_BASED_OUTPATIENT_CLINIC_OR_DEPARTMENT_OTHER)
Admission: EM | Admit: 2011-12-19 | Discharge: 2011-12-19 | Disposition: A | Discharge status: Home / Self Care | Payer: Medicare Other | Attending: Emergency Medicine | Admitting: Emergency Medicine

## 2011-12-19 DIAGNOSIS — I10 Essential (primary) hypertension: Secondary | ICD-10-CM | POA: Insufficient documentation

## 2011-12-19 DIAGNOSIS — Z951 Presence of aortocoronary bypass graft: Secondary | ICD-10-CM | POA: Insufficient documentation

## 2011-12-19 DIAGNOSIS — E7800 Pure hypercholesterolemia, unspecified: Secondary | ICD-10-CM | POA: Insufficient documentation

## 2011-12-19 DIAGNOSIS — L509 Urticaria, unspecified: Secondary | ICD-10-CM

## 2011-12-19 MED ORDER — DIPHENHYDRAMINE HCL 50 MG PO CAPS
50.0000 mg | ORAL_CAPSULE | Freq: Once | ORAL | Status: AC
  Administered 2011-12-19: 50 mg via ORAL
  Filled 2011-12-19: qty 1

## 2011-12-19 MED ORDER — DIPHENHYDRAMINE HCL 25 MG PO CAPS
ORAL_CAPSULE | ORAL | Status: AC
  Filled 2011-12-19: qty 2

## 2011-12-19 MED ORDER — PREDNISONE 50 MG PO TABS
60.0000 mg | ORAL_TABLET | Freq: Once | ORAL | Status: AC
  Administered 2011-12-19: 60 mg via ORAL
  Filled 2011-12-19: qty 1

## 2011-12-19 MED ORDER — PREDNISONE 50 MG PO TABS: 50.0000 mg | ORAL_TABLET | Freq: Every day | ORAL | Status: DC

## 2011-12-19 MED ORDER — DIPHENHYDRAMINE HCL 25 MG PO TABS: 50.0000 mg | ORAL_TABLET | Freq: Four times a day (QID) | ORAL | Status: DC

## 2011-12-19 NOTE — Discharge Instructions (Signed)
Return to the ED with any concerns including difficulty breathing, lip or tongue swelling, vomiting, decreased level of alertness/lethargy, or any other alarming symptoms °

## 2011-12-19 NOTE — ED Provider Notes (Signed)
History     CSN: [id number]  Arrival date & time 12/19/11  2148   First MD Initiated Contact with Patient 12/19/11 2246      Chief Complaint  Patient presents with  . Urticaria    (Consider location/radiation/quality/duration/timing/severity/associated sxs/prior treatment) HPI Patient presents with complaint of hives. He states the hives started tonight. He felt itching of his chest and arms and upper back and then when he took a shower he noted that there was a rash associated with itching. He's had no lip or tongue swelling, no difficulty breathing no vomiting no loss of consciousness. He has had no recent changes in medications or new foods. He states that he has had one similar episode to this approximately 2 years ago that resolved with Benadryl and steroids. There are no other associated systemic symptoms. The symptoms are constant and moderate. There are no alleviating or modifying factors and patient did not take anything for treatment prior to arrival in the ED period  Past Medical History  Diagnosis Date  . Hypertension   . High cholesterol     Past Surgical History  Procedure Date  . Cholecystectomy   . Coronary artery bypass graft   . Vasectomy     History reviewed. No pertinent family history.  History  Substance Use Topics  . Smoking status: Never Smoker   . Smokeless tobacco: Not on file  . Alcohol Use: Yes      Review of Systems ROS reviewed and all otherwise negative except for mentioned in HPI  Allergies  Review of patient's allergies indicates no known allergies.  Home Medications   Current Outpatient Rx  Name Route Sig Dispense Refill  . ALLOPURINOL 300 MG PO TABS Oral Take 300 mg by mouth daily.    . ASPIRIN 81 MG PO TABS Oral Take 81 mg by mouth daily.    . GLYBURIDE 5 MG PO TABS Oral Take 5 mg by mouth 2 (two) times daily.    . IBUPROFEN 200 MG PO TABS Oral Take 600 mg by mouth every 6 (six) hours as needed. Patient used this medication for  the pain in his shoulder and hips.    Marland Kitchen LISINOPRIL 10 MG PO TABS Oral Take 10 mg by mouth daily.    Marland Kitchen LOSARTAN POTASSIUM 50 MG PO TABS Oral Take 50 mg by mouth daily.    Marland Kitchen METFORMIN HCL 1000 MG PO TABS Oral Take 1,000 mg by mouth 2 (two) times daily with a meal.    . METOPROLOL TARTRATE 50 MG PO TABS Oral Take 50 mg by mouth 2 (two) times daily.    Marland Kitchen SIMVASTATIN 20 MG PO TABS Oral Take 20 mg by mouth every evening.    Marland Kitchen TAMSULOSIN HCL 0.4 MG PO CAPS Oral Take 0.4 mg by mouth.    Marland Kitchen DIPHENHYDRAMINE HCL 25 MG PO TABS Oral Take 2 tablets (50 mg total) by mouth every 6 (six) hours. Take 1-2 tablets every 6 hours x 2 days, then space out to an as needed basis 20 tablet 0  . PREDNISONE 50 MG PO TABS Oral Take 1 tablet (50 mg total) by mouth daily. 5 tablet 0    BP 170/92  Pulse 69  Temp(Src) 98.2 F (36.8 C) (Oral)  Resp 18  Ht 5\' 11"  (1.803 m)  Wt 260 lb (117.935 kg)  BMI 36.26 kg/m2  SpO2 100% Vitals reviewed Physical Exam Physical Examination: General appearance - alert, well appearing, and in no distress Mental status - alert,  oriented to person, place, and time Eyes - pupils equal and reactive, no conjunctival injection Mouth - mucous membranes moist, pharynx normal without lesions, no lip or tongue swelling Chest - clear to auscultation, no wheezes, rales or rhonchi, symmetric air entry Heart - normal rate, regular rhythm, normal S1, S2, no murmurs, rubs, clicks or gallops Extremities - peripheral pulses normal, no pedal edema, no clubbing or cyanosis Skin - diffuse somewhat confluent hives overlying bilateral upper arms, chest and upper back, no other rash, normal skin turgor.   ED Course  Procedures (including critical care time)  Labs Reviewed - No data to display No results found.   1. Hives       MDM  Patient presenting with diffuse upper body hives. He has no signs of anaphylaxis or symptoms. He was given Benadryl and steroids in the emergency department. He was  discharged with strict return precautions and will arrange for followup with his primary care Dr. Patient was agreeable with this plan.        Ethelda Chick, MD 12/19/11 [phone]

## 2011-12-19 NOTE — ED Notes (Addendum)
Pt c/o hives to upper portion of body x 1 day

## 2014-04-18 ENCOUNTER — Emergency Department (HOSPITAL_BASED_OUTPATIENT_CLINIC_OR_DEPARTMENT_OTHER)
Admission: EM | Admit: 2014-04-18 | Discharge: 2014-04-18 | Disposition: A | Discharge status: Home / Self Care | Payer: Medicare Other | Attending: Emergency Medicine | Admitting: Emergency Medicine

## 2014-04-18 ENCOUNTER — Encounter (HOSPITAL_BASED_OUTPATIENT_CLINIC_OR_DEPARTMENT_OTHER): Payer: Self-pay | Admitting: Emergency Medicine

## 2014-04-18 ENCOUNTER — Emergency Department (HOSPITAL_BASED_OUTPATIENT_CLINIC_OR_DEPARTMENT_OTHER): Payer: Medicare Other

## 2014-04-18 DIAGNOSIS — R079 Chest pain, unspecified: Secondary | ICD-10-CM | POA: Diagnosis present

## 2014-04-18 DIAGNOSIS — Z79899 Other long term (current) drug therapy: Secondary | ICD-10-CM | POA: Diagnosis not present

## 2014-04-18 DIAGNOSIS — E7800 Pure hypercholesterolemia, unspecified: Secondary | ICD-10-CM | POA: Diagnosis not present

## 2014-04-18 DIAGNOSIS — IMO0002 Reserved for concepts with insufficient information to code with codable children: Secondary | ICD-10-CM | POA: Diagnosis not present

## 2014-04-18 DIAGNOSIS — Z7982 Long term (current) use of aspirin: Secondary | ICD-10-CM | POA: Diagnosis not present

## 2014-04-18 DIAGNOSIS — Z8589 Personal history of malignant neoplasm of other organs and systems: Secondary | ICD-10-CM | POA: Insufficient documentation

## 2014-04-18 DIAGNOSIS — I10 Essential (primary) hypertension: Secondary | ICD-10-CM | POA: Diagnosis not present

## 2014-04-18 DIAGNOSIS — R091 Pleurisy: Secondary | ICD-10-CM | POA: Diagnosis not present

## 2014-04-18 HISTORY — DX: Malignant (primary) neoplasm, unspecified: C80.1

## 2014-04-18 LAB — CBC WITH DIFFERENTIAL/PLATELET
BASOS ABS: 0 10*3/uL (ref 0.0–0.1)
Basophils Relative: 0 % (ref 0–1)
EOS PCT: 2 % (ref 0–5)
Eosinophils Absolute: 0.1 10*3/uL (ref 0.0–0.7)
HCT: 34.2 % — ABNORMAL LOW (ref 39.0–52.0)
Hemoglobin: 11.4 g/dL — ABNORMAL LOW (ref 13.0–17.0)
LYMPHS ABS: 0.7 10*3/uL (ref 0.7–4.0)
LYMPHS PCT: 12 % (ref 12–46)
MCH: 31.1 pg (ref 26.0–34.0)
MCHC: 33.3 g/dL (ref 30.0–36.0)
MCV: 93.4 fL (ref 78.0–100.0)
Monocytes Absolute: 0.5 10*3/uL (ref 0.1–1.0)
Monocytes Relative: 8 % (ref 3–12)
NEUTROS ABS: 4.7 10*3/uL (ref 1.7–7.7)
NEUTROS PCT: 78 % — AB (ref 43–77)
PLATELETS: 132 10*3/uL — AB (ref 150–400)
RBC: 3.66 MIL/uL — AB (ref 4.22–5.81)
RDW: 15.2 % (ref 11.5–15.5)
WBC: 6 10*3/uL (ref 4.0–10.5)

## 2014-04-18 LAB — BASIC METABOLIC PANEL
Anion gap: 17 — ABNORMAL HIGH (ref 5–15)
BUN: 20 mg/dL (ref 6–23)
CALCIUM: 9.5 mg/dL (ref 8.4–10.5)
CO2: 21 mEq/L (ref 19–32)
Chloride: 102 mEq/L (ref 96–112)
Creatinine, Ser: 1.2 mg/dL (ref 0.50–1.35)
GFR, EST AFRICAN AMERICAN: 68 mL/min — AB (ref >90)
GFR, EST NON AFRICAN AMERICAN: 59 mL/min — AB (ref >90)
GLUCOSE: 176 mg/dL — AB (ref 70–99)
Potassium: 4.4 mEq/L (ref 3.7–5.3)
Sodium: 140 mEq/L (ref 137–147)

## 2014-04-18 LAB — D-DIMER, QUANTITATIVE: D-Dimer, Quant: 0.74 ug/mL-FEU — ABNORMAL HIGH (ref 0.00–0.48)

## 2014-04-18 LAB — TROPONIN I

## 2014-04-18 MED ORDER — IOHEXOL 350 MG/ML SOLN
100.0000 mL | Freq: Once | INTRAVENOUS | Status: AC | PRN
  Administered 2014-04-18: 100 mL via INTRAVENOUS

## 2014-04-18 MED ORDER — NAPROXEN 500 MG PO TABS: 500.0000 mg | ORAL_TABLET | Freq: Two times a day (BID) | ORAL | Status: DC | PRN

## 2014-04-18 MED ORDER — KETOROLAC TROMETHAMINE 30 MG/ML IJ SOLN
30.0000 mg | Freq: Once | INTRAMUSCULAR | Status: AC
  Administered 2014-04-18: 30 mg via INTRAVENOUS
  Filled 2014-04-18: qty 1

## 2014-04-18 NOTE — Discharge Instructions (Signed)

## 2014-04-18 NOTE — ED Provider Notes (Signed)
CSN: [id number]     Arrival date & time 04/18/14  0810 History   First MD Initiated Contact with Patient 04/18/14 [phone]     Chief Complaint  Patient presents with  . Chest Pain      HPI  Patient presents with left-sided chest pain. Present for 3 weeks. Evaluated at the [facility] in [place] 2 weeks ago. Had "an EKG and labs".  Denies shortness of breath. No pain with exertion. His only present if he lays down. Presently lays on his right side, worse if he lays on his left side. Not present he lays on his back. Sharp and well localized. No additional symptoms. History of coronary bypass grafting and right leg saphenous vein harvesting. This is in 2002.  Past Medical History  Diagnosis Date  . Hypertension   . High cholesterol   . Cancer    Past Surgical History  Procedure Laterality Date  . Cholecystectomy    . Coronary artery bypass graft    . Vasectomy    . Colon surgery      mass removed from colon that tested positive for cancer   No family history on file. History  Substance Use Topics  . Smoking status: Never Smoker   . Smokeless tobacco: Not on file  . Alcohol Use: Yes    Review of Systems  Constitutional: Negative for fever, chills, diaphoresis, appetite change and fatigue.  HENT: Negative for mouth sores, sore throat and trouble swallowing.   Eyes: Negative for visual disturbance.  Respiratory: Negative for cough, chest tightness, shortness of breath and wheezing.   Cardiovascular: Positive for chest pain.  Gastrointestinal: Negative for nausea, vomiting, abdominal pain, diarrhea and abdominal distention.  Endocrine: Negative for polydipsia, polyphagia and polyuria.  Genitourinary: Negative for dysuria, frequency and hematuria.  Musculoskeletal: Negative for gait problem.  Skin: Negative for color change, pallor and rash.  Neurological: Negative for dizziness, syncope, light-headedness and headaches.  Hematological: Does not bruise/bleed easily.   Psychiatric/Behavioral: Negative for behavioral problems and confusion.      Allergies  Review of patient's allergies indicates no known allergies.  Home Medications   Prior to Admission medications   Medication Sig Start Date End Date Taking? Authorizing Provider  aspirin 325 MG tablet Take 325 mg by mouth daily.   Yes Historical Provider, MD  Magnesium 400 MG TABS Take 800 mg by mouth 2 (two) times daily.   Yes Historical Provider, MD  allopurinol (ZYLOPRIM) 300 MG tablet Take 300 mg by mouth daily.    Historical Provider, MD  aspirin 81 MG tablet Take 325 mg by mouth daily.     Historical Provider, MD  diphenhydrAMINE (BENADRYL) 25 MG tablet Take 2 tablets (50 mg total) by mouth every 6 (six) hours. Take 1-2 tablets every 6 hours x 2 days, then space out to an as needed basis 12/19/11 01/18/12  Threasa Beards, MD  glyBURIDE (DIABETA) 5 MG tablet Take 5 mg by mouth 2 (two) times daily.    Historical Provider, MD  ibuprofen (ADVIL,MOTRIN) 200 MG tablet Take 600 mg by mouth every 6 (six) hours as needed. Patient used this medication for the pain in his shoulder and hips.    Historical Provider, MD  lisinopril (PRINIVIL,ZESTRIL) 10 MG tablet Take 10 mg by mouth daily.    Historical Provider, MD  losartan (COZAAR) 50 MG tablet Take 100 mg by mouth daily.     Historical Provider, MD  metFORMIN (GLUCOPHAGE) 1000 MG tablet Take 500 mg by mouth 2 (  two) times daily.     Historical Provider, MD  metoprolol (LOPRESSOR) 50 MG tablet Take 50 mg by mouth 2 (two) times daily.    Historical Provider, MD  naproxen (NAPROSYN) 500 MG tablet Take 1 tablet (500 mg total) by mouth 2 (two) times daily as needed for moderate pain. 04/18/14   Tanna Furry, MD  predniSONE (DELTASONE) 50 MG tablet Take 1 tablet (50 mg total) by mouth daily. 12/19/11   Threasa Beards, MD  simvastatin (ZOCOR) 20 MG tablet Take 20 mg by mouth every evening.    Historical Provider, MD  Tamsulosin HCl (FLOMAX) 0.4 MG CAPS Take 0.4 mg by  mouth.    Historical Provider, MD   BP 132/62  Pulse 55  Temp(Src) 97.7 F (36.5 C) (Oral)  Resp 16  Ht 5\' 10"  (1.778 m)  Wt 245 lb (111.131 kg)  BMI 35.15 kg/m2  SpO2 100% Physical Exam  Constitutional: He is oriented to person, place, and time. He appears well-developed and well-nourished. No distress.  HENT:  Head: Normocephalic.  Eyes: Conjunctivae are normal. Pupils are equal, round, and reactive to light. No scleral icterus.  Neck: Normal range of motion. Neck supple. No thyromegaly present.  Cardiovascular: Normal rate and regular rhythm.  Exam reveals no gallop and no friction rub.   No murmur heard. Pulmonary/Chest: Effort normal and breath sounds normal. No respiratory distress. He has no wheezes. He has no rales.  Normal cardiopulmonary exam. No pleural or pericardial friction rubs no diminished breath sounds no wheezing rales rhonchi. Right leg 2 cm larger than left with graft incision well-healed.  Abdominal: Soft. Bowel sounds are normal. He exhibits no distension. There is no tenderness. There is no rebound.  Musculoskeletal: Normal range of motion.  Neurological: He is alert and oriented to person, place, and time.  Skin: Skin is warm and dry. No rash noted.  Psychiatric: He has a normal mood and affect. His behavior is normal.    ED Course  Procedures (including critical care time) Labs Review Labs Reviewed  CBC WITH DIFFERENTIAL - Abnormal; Notable for the following:    RBC 3.66 (*)    Hemoglobin 11.4 (*)    HCT 34.2 (*)    Platelets 132 (*)    Neutrophils Relative % 78 (*)    All other components within normal limits  BASIC METABOLIC PANEL - Abnormal; Notable for the following:    Glucose, Bld 176 (*)    GFR calc non Af Amer 59 (*)    GFR calc Af Amer 68 (*)    Anion gap 17 (*)    All other components within normal limits  D-DIMER, QUANTITATIVE - Abnormal; Notable for the following:    D-Dimer, Quant 0.74 (*)    All other components within normal  limits  TROPONIN I    Imaging Review Dg Chest 2 View  04/18/2014   CLINICAL DATA:  Chest pain.  EXAM: CHEST  2 VIEW  COMPARISON:  10/12/2011.  FINDINGS: Power port noted in good anatomic position. Tip is at the cavoatrial junction. Prior CABG. Cardiomegaly. Pulmonary vascularity normal. Lungs are clear. No pleural effusion or pneumothorax.  IMPRESSION: 1. Barb port catheter in good anatomic position. 2. Stable cardiomegaly. Prior CABG. No CHF. No acute pulmonary disease.   Electronically Signed   By: Marcello Moores  Register   On: 04/18/2014 09:21   Ct Angio Chest Pe W/cm &/or Wo Cm  04/18/2014   CLINICAL DATA:  73 year old male with chest pain. Abnormal D-dimer. Initial  encounter. History of hypertension, lymphoma, CABG.  EXAM: CT ANGIOGRAPHY CHEST WITH CONTRAST  TECHNIQUE: Multidetector CT imaging of the chest was performed using the standard protocol during bolus administration of intravenous contrast. Multiplanar CT image reconstructions and MIPs were obtained to evaluate the vascular anatomy.  CONTRAST:  164mL OMNIPAQUE IOHEXOL 350 MG/ML SOLN  COMPARISON:  Chest radiographs 0924 hr the same day and earlier.  FINDINGS: Good contrast bolus timing in the pulmonary arterial tree.  No focal filling defect identified in the pulmonary arterial tree to suggest the presence of acute pulmonary embolism.  Major airways are patent. Minor linear scarring in the right middle lobe. Minimal dependent pulmonary atelectasis.  Mediastinal lipomatosis. Sequelae of CABG. No pericardial or pleural effusion. No mediastinal lymphadenopathy.  Left chest porta cath partially visible. Otherwise negative visualized thoracic inlet.  Atherosclerosis of the aorta. Negative visible liver, spleen, and bowel in the upper abdomen.  No acute osseous abnormality identified.  Review of the MIP images confirms the above findings.  IMPRESSION: 1.  No evidence of acute pulmonary embolus. 2. No acute findings in the chest.   Electronically Signed    By: Lars Pinks M.D.   On: 04/18/2014 10:58     EKG Interpretation   Date/Time:  Sunday April 18 2014 08:16:41 EDT Ventricular Rate:  57 PR Interval:  194 QRS Duration: 104 QT Interval:  428 QTC Calculation: 416 R Axis:   40 Text Interpretation:  Sinus rhythm with PACs Abnormal ECG Confirmed by  Jeneen Rinks  MD, [place] ([id number]) on 04/18/2014 8:22:43 AM      MDM   Final diagnoses:  Pleurisy   EKG shows no acute findings. Normal troponin. Mild elevation of d-dimer. Negative chest angiogram for PE. His evaluation, normal exam, timeline of symptoms. All consistent with pleurisy. Plan be treated with anti-inflammatories and primary care followup.    Tanna Furry, MD 04/18/14 [phone]

## 2014-04-18 NOTE — ED Notes (Signed)
Patient c/o chest pain  over the past three weeks. Does not hurt when ambulating , but starts to hurt when he lies down on his left side. Does not hurt if he lies flat on his back. Went to the [facility] hospital last week and states they did not find any reason for the pain

## 2015-10-03 ENCOUNTER — Emergency Department (HOSPITAL_BASED_OUTPATIENT_CLINIC_OR_DEPARTMENT_OTHER): Payer: No Typology Code available for payment source

## 2015-10-03 ENCOUNTER — Encounter (HOSPITAL_BASED_OUTPATIENT_CLINIC_OR_DEPARTMENT_OTHER): Payer: Self-pay | Admitting: *Deleted

## 2015-10-03 ENCOUNTER — Emergency Department (HOSPITAL_BASED_OUTPATIENT_CLINIC_OR_DEPARTMENT_OTHER)
Admission: EM | Admit: 2015-10-03 | Discharge: 2015-10-03 | Disposition: A | Discharge status: Home / Self Care | Payer: No Typology Code available for payment source | Attending: Emergency Medicine | Admitting: Emergency Medicine

## 2015-10-03 DIAGNOSIS — Z7982 Long term (current) use of aspirin: Secondary | ICD-10-CM | POA: Insufficient documentation

## 2015-10-03 DIAGNOSIS — R509 Fever, unspecified: Secondary | ICD-10-CM | POA: Diagnosis present

## 2015-10-03 DIAGNOSIS — Z951 Presence of aortocoronary bypass graft: Secondary | ICD-10-CM | POA: Diagnosis not present

## 2015-10-03 DIAGNOSIS — J111 Influenza due to unidentified influenza virus with other respiratory manifestations: Secondary | ICD-10-CM | POA: Diagnosis not present

## 2015-10-03 DIAGNOSIS — Z7984 Long term (current) use of oral hypoglycemic drugs: Secondary | ICD-10-CM | POA: Diagnosis not present

## 2015-10-03 DIAGNOSIS — I10 Essential (primary) hypertension: Secondary | ICD-10-CM | POA: Insufficient documentation

## 2015-10-03 DIAGNOSIS — Z8589 Personal history of malignant neoplasm of other organs and systems: Secondary | ICD-10-CM | POA: Insufficient documentation

## 2015-10-03 DIAGNOSIS — R0789 Other chest pain: Secondary | ICD-10-CM | POA: Diagnosis not present

## 2015-10-03 DIAGNOSIS — E7800 Pure hypercholesterolemia, unspecified: Secondary | ICD-10-CM | POA: Insufficient documentation

## 2015-10-03 DIAGNOSIS — Z7952 Long term (current) use of systemic steroids: Secondary | ICD-10-CM | POA: Diagnosis not present

## 2015-10-03 DIAGNOSIS — Z79899 Other long term (current) drug therapy: Secondary | ICD-10-CM | POA: Insufficient documentation

## 2015-10-03 MED ORDER — ALBUTEROL SULFATE HFA 108 (90 BASE) MCG/ACT IN AERS
2.0000 | INHALATION_SPRAY | RESPIRATORY_TRACT | Status: DC | PRN
  Administered 2015-10-03: 2 via RESPIRATORY_TRACT
  Filled 2015-10-03: qty 6.7

## 2015-10-03 MED ORDER — OSELTAMIVIR PHOSPHATE 75 MG PO CAPS: 75.0000 mg | ORAL_CAPSULE | Freq: Two times a day (BID) | ORAL | Status: DC

## 2015-10-03 NOTE — ED Notes (Signed)
Pt has had fever for several days, not getting any better

## 2015-10-03 NOTE — Discharge Instructions (Signed)

## 2015-10-03 NOTE — ED Notes (Signed)
Cough, chills possible fever since yesterday.

## 2015-10-03 NOTE — ED Provider Notes (Signed)
CSN: [id number]     Arrival date & time 10/03/15  2148 History  By signing my name below, I, Doran Stabler, attest that this documentation has been prepared under the direction and in the presence of Shanon Rosser, MD. Electronically Signed: Doran Stabler, ED Scribe. 10/03/2015. 11:17 PM.   Chief Complaint  Patient presents with  . Fever   The history is provided by the patient. No language interpreter was used.   HPI Comments: Dwayne Brown is a 75 y.o. male with a PMHx of HTN and HLD who presents to the Emergency Department for an evaluation of a possible flu with symptoms onset yesterday. Pt c/o a cough with associated chest wall pain, chills and rhinorrhea. Pt states his mother had the flu last week and he had been in contact with her; she died of her illness 4 days ago. Pt denies any nausea, vomiting, diarrhea or any other symptoms at this time.   Past Medical History  Diagnosis Date  . Hypertension   . High cholesterol   . Cancer [facility])    Past Surgical History  Procedure Laterality Date  . Cholecystectomy    . Coronary artery bypass graft    . Vasectomy    . Colon surgery      mass removed from colon that tested positive for cancer   No family history on file. Social History  Substance Use Topics  . Smoking status: Never Smoker   . Smokeless tobacco: None  . Alcohol Use: Yes    Review of Systems  All other systems reviewed and are negative.   Allergies  Review of patient's allergies indicates no known allergies.  Home Medications   Prior to Admission medications   Medication Sig Start Date End Date Taking? Authorizing Provider  allopurinol (ZYLOPRIM) 300 MG tablet Take 300 mg by mouth daily.    Historical Provider, MD  aspirin 325 MG tablet Take 325 mg by mouth daily.    Historical Provider, MD  aspirin 81 MG tablet Take 325 mg by mouth daily.     Historical Provider, MD  diphenhydrAMINE (BENADRYL) 25 MG tablet Take 2 tablets (50 mg total) by mouth every 6  (six) hours. Take 1-2 tablets every 6 hours x 2 days, then space out to an as needed basis 12/19/11 01/18/12  Alfonzo Beers, MD  glyBURIDE (DIABETA) 5 MG tablet Take 5 mg by mouth 2 (two) times daily.    Historical Provider, MD  ibuprofen (ADVIL,MOTRIN) 200 MG tablet Take 600 mg by mouth every 6 (six) hours as needed. Patient used this medication for the pain in his shoulder and hips.    Historical Provider, MD  lisinopril (PRINIVIL,ZESTRIL) 10 MG tablet Take 10 mg by mouth daily.    Historical Provider, MD  losartan (COZAAR) 50 MG tablet Take 100 mg by mouth daily.     Historical Provider, MD  Magnesium 400 MG TABS Take 800 mg by mouth 2 (two) times daily.    Historical Provider, MD  metFORMIN (GLUCOPHAGE) 1000 MG tablet Take 500 mg by mouth 2 (two) times daily.     Historical Provider, MD  metoprolol (LOPRESSOR) 50 MG tablet Take 50 mg by mouth 2 (two) times daily.    Historical Provider, MD  naproxen (NAPROSYN) 500 MG tablet Take 1 tablet (500 mg total) by mouth 2 (two) times daily as needed for moderate pain. 04/18/14   Tanna Furry, MD  oseltamivir (TAMIFLU) 75 MG capsule Take 1 capsule (75 mg total) by mouth every 12 (  twelve) hours. 10/03/15   Ojas Coone, MD  predniSONE (DELTASONE) 50 MG tablet Take 1 tablet (50 mg total) by mouth daily. 12/19/11   Alfonzo Beers, MD  simvastatin (ZOCOR) 20 MG tablet Take 20 mg by mouth every evening.    Historical Provider, MD  Tamsulosin HCl (FLOMAX) 0.4 MG CAPS Take 0.4 mg by mouth.    Historical Provider, MD   BP 146/68 mmHg  Pulse 76  Temp(Src) 99.2 F (37.3 C) (Oral)  Resp 18  Ht 5\' 10"  (1.778 m)  Wt 250 lb (113.399 kg)  BMI 35.87 kg/m2  SpO2 100%   Physical Exam  General: Well-developed, well-nourished male in no acute distress; appearance consistent with age of record HENT: normocephalic; atraumatic Eyes: pupils equal, round and reactive to light; extraocular muscles intact Neck: supple Heart: regular rate and rhythm Lungs: clear to auscultation  bilaterally, clear to auscultation; cough paroxysms with deep inspiration.  Abdomen: soft; nondistended; nontender; no masses or hepatosplenomegaly; bowel sounds present Extremities: No deformity; full range of motion; pulses normal Neurologic: Awake, alert and oriented; motor function intact in all extremities and symmetric; no facial droop Skin: Warm and dry Psychiatric: Normal mood and affect   ED Course  Procedures   DIAGNOSTIC STUDIES: Oxygen Saturation is 100% on room air, normal by my interpretation.    COORDINATION OF CARE: 11:20 PM Discussed treatment plan with pt at bedside and pt agreed to plan.  MDM  Nursing notes and vitals signs, including pulse oximetry, reviewed.  Summary of this visit's results, reviewed by myself:  Imaging Studies: Dg Chest 2 View  10/03/2015  CLINICAL DATA:  75 year old male with cough, chills and fever EXAM: CHEST  2 VIEW COMPARISON:  Prior chest x-ray 04/18/2014 FINDINGS: Stable cardiac and mediastinal contours prominent epicardial fat pad bilaterally. Patient is status post median sternotomy with evidence of prior multivessel CABG including LIMA bypass. Left subclavian port catheter has been removed compared to prior. No focal airspace consolidation, pleural effusion, pulmonary edema or pneumothorax. No suspicious mass or nodule. No acute osseous abnormality. IMPRESSION: No acute cardiopulmonary process. Electronically Signed   By: Jacqulynn Cadet M.D.   On: 10/03/2015 22:40     Final diagnoses:  Influenza   I personally performed the services described in this documentation, which was scribed in my presence. The recorded information has been reviewed and is accurate.   Shanon Rosser, MD 10/03/15 [phone]

## 2015-11-20 ENCOUNTER — Encounter (HOSPITAL_BASED_OUTPATIENT_CLINIC_OR_DEPARTMENT_OTHER): Payer: Self-pay | Admitting: *Deleted

## 2015-11-20 ENCOUNTER — Observation Stay (HOSPITAL_BASED_OUTPATIENT_CLINIC_OR_DEPARTMENT_OTHER)
Admission: EM | Admit: 2015-11-20 | Discharge: 2015-11-22 | Disposition: A | Discharge status: Home / Self Care | Payer: Medicare Other | Attending: Internal Medicine | Admitting: Internal Medicine

## 2015-11-20 ENCOUNTER — Emergency Department (HOSPITAL_BASED_OUTPATIENT_CLINIC_OR_DEPARTMENT_OTHER): Payer: Medicare Other

## 2015-11-20 DIAGNOSIS — D649 Anemia, unspecified: Secondary | ICD-10-CM | POA: Diagnosis present

## 2015-11-20 DIAGNOSIS — E7800 Pure hypercholesterolemia, unspecified: Secondary | ICD-10-CM | POA: Diagnosis present

## 2015-11-20 DIAGNOSIS — I129 Hypertensive chronic kidney disease with stage 1 through stage 4 chronic kidney disease, or unspecified chronic kidney disease: Secondary | ICD-10-CM | POA: Diagnosis not present

## 2015-11-20 DIAGNOSIS — Z85038 Personal history of other malignant neoplasm of large intestine: Secondary | ICD-10-CM | POA: Diagnosis not present

## 2015-11-20 DIAGNOSIS — D696 Thrombocytopenia, unspecified: Secondary | ICD-10-CM | POA: Diagnosis not present

## 2015-11-20 DIAGNOSIS — I10 Essential (primary) hypertension: Secondary | ICD-10-CM | POA: Diagnosis present

## 2015-11-20 DIAGNOSIS — Z79899 Other long term (current) drug therapy: Secondary | ICD-10-CM | POA: Insufficient documentation

## 2015-11-20 DIAGNOSIS — I4510 Unspecified right bundle-branch block: Secondary | ICD-10-CM | POA: Diagnosis not present

## 2015-11-20 DIAGNOSIS — I25810 Atherosclerosis of coronary artery bypass graft(s) without angina pectoris: Secondary | ICD-10-CM | POA: Diagnosis not present

## 2015-11-20 DIAGNOSIS — N183 Chronic kidney disease, stage 3 unspecified: Secondary | ICD-10-CM | POA: Diagnosis present

## 2015-11-20 DIAGNOSIS — Z8249 Family history of ischemic heart disease and other diseases of the circulatory system: Secondary | ICD-10-CM | POA: Insufficient documentation

## 2015-11-20 DIAGNOSIS — E1122 Type 2 diabetes mellitus with diabetic chronic kidney disease: Secondary | ICD-10-CM | POA: Insufficient documentation

## 2015-11-20 DIAGNOSIS — E785 Hyperlipidemia, unspecified: Secondary | ICD-10-CM | POA: Diagnosis not present

## 2015-11-20 DIAGNOSIS — E1165 Type 2 diabetes mellitus with hyperglycemia: Secondary | ICD-10-CM | POA: Insufficient documentation

## 2015-11-20 DIAGNOSIS — E119 Type 2 diabetes mellitus without complications: Secondary | ICD-10-CM

## 2015-11-20 DIAGNOSIS — Z7984 Long term (current) use of oral hypoglycemic drugs: Secondary | ICD-10-CM | POA: Diagnosis not present

## 2015-11-20 DIAGNOSIS — R079 Chest pain, unspecified: Secondary | ICD-10-CM | POA: Diagnosis not present

## 2015-11-20 DIAGNOSIS — Z951 Presence of aortocoronary bypass graft: Secondary | ICD-10-CM | POA: Diagnosis not present

## 2015-11-20 DIAGNOSIS — R0789 Other chest pain: Secondary | ICD-10-CM

## 2015-11-20 DIAGNOSIS — I25709 Atherosclerosis of coronary artery bypass graft(s), unspecified, with unspecified angina pectoris: Secondary | ICD-10-CM | POA: Diagnosis not present

## 2015-11-20 DIAGNOSIS — I25700 Atherosclerosis of coronary artery bypass graft(s), unspecified, with unstable angina pectoris: Secondary | ICD-10-CM

## 2015-11-20 DIAGNOSIS — I471 Supraventricular tachycardia: Principal | ICD-10-CM | POA: Insufficient documentation

## 2015-11-20 HISTORY — DX: Atherosclerosis of coronary artery bypass graft(s) without angina pectoris: I25.810

## 2015-11-20 HISTORY — DX: Essential (primary) hypertension: I10

## 2015-11-20 HISTORY — DX: Type 2 diabetes mellitus without complications: E11.9

## 2015-11-20 LAB — COMPREHENSIVE METABOLIC PANEL
ALK PHOS: 64 U/L (ref 38–126)
ALT: 36 U/L (ref 17–63)
AST: 35 U/L (ref 15–41)
Albumin: 3.8 g/dL (ref 3.5–5.0)
Anion gap: 9 (ref 5–15)
BILIRUBIN TOTAL: 0.5 mg/dL (ref 0.3–1.2)
BUN: 20 mg/dL (ref 6–20)
CALCIUM: 8.9 mg/dL (ref 8.9–10.3)
CHLORIDE: 104 mmol/L (ref 101–111)
CO2: 23 mmol/L (ref 22–32)
CREATININE: 1.31 mg/dL — AB (ref 0.61–1.24)
GFR calc Af Amer: >60 mL/min (ref >60)
GFR, EST NON AFRICAN AMERICAN: 52 mL/min — AB (ref >60)
Glucose, Bld: 266 mg/dL — ABNORMAL HIGH (ref 65–99)
Potassium: 4.2 mmol/L (ref 3.5–5.1)
Sodium: 136 mmol/L (ref 135–145)
TOTAL PROTEIN: 6.6 g/dL (ref 6.5–8.1)

## 2015-11-20 LAB — TROPONIN I
Troponin I: <0.03 ng/mL (ref <0.031)
Troponin I: <0.03 ng/mL (ref <0.031)

## 2015-11-20 LAB — CBC WITH DIFFERENTIAL/PLATELET
Basophils Absolute: 0 10*3/uL (ref 0.0–0.1)
Basophils Relative: 0 %
EOS PCT: 1 %
Eosinophils Absolute: 0.1 10*3/uL (ref 0.0–0.7)
HCT: 36.2 % — ABNORMAL LOW (ref 39.0–52.0)
Hemoglobin: 12.1 g/dL — ABNORMAL LOW (ref 13.0–17.0)
Lymphocytes Relative: 16 %
Lymphs Abs: 0.8 10*3/uL (ref 0.7–4.0)
MCH: 32.1 pg (ref 26.0–34.0)
MCHC: 33.4 g/dL (ref 30.0–36.0)
MCV: 96 fL (ref 78.0–100.0)
MONO ABS: 0.4 10*3/uL (ref 0.1–1.0)
MONOS PCT: 7 %
NEUTROS ABS: 3.9 10*3/uL (ref 1.7–7.7)
Neutrophils Relative %: 76 %
PLATELETS: 125 10*3/uL — AB (ref 150–400)
RBC: 3.77 MIL/uL — ABNORMAL LOW (ref 4.22–5.81)
RDW: 14.4 % (ref 11.5–15.5)
WBC: 5.2 10*3/uL (ref 4.0–10.5)

## 2015-11-20 LAB — BRAIN NATRIURETIC PEPTIDE: B Natriuretic Peptide: 53.7 pg/mL (ref 0.0–100.0)

## 2015-11-20 LAB — CBG MONITORING, ED: Glucose-Capillary: 270 mg/dL — ABNORMAL HIGH (ref 65–99)

## 2015-11-20 MED ORDER — ONDANSETRON HCL 4 MG/2ML IJ SOLN: 4.0000 mg | Freq: Four times a day (QID) | INTRAMUSCULAR | Status: DC | PRN

## 2015-11-20 MED ORDER — SIMVASTATIN 40 MG PO TABS
40.0000 mg | ORAL_TABLET | Freq: Every day | ORAL | Status: DC
  Filled 2015-11-20: qty 1

## 2015-11-20 MED ORDER — NITROGLYCERIN 0.4 MG SL SUBL: 0.4000 mg | SUBLINGUAL_TABLET | SUBLINGUAL | Status: DC | PRN

## 2015-11-20 MED ORDER — SODIUM CHLORIDE 0.9% FLUSH
3.0000 mL | Freq: Two times a day (BID) | INTRAVENOUS | Status: DC
  Administered 2015-11-20 – 2015-11-22 (×4): 3 mL via INTRAVENOUS

## 2015-11-20 MED ORDER — ALLOPURINOL 300 MG PO TABS
300.0000 mg | ORAL_TABLET | Freq: Every day | ORAL | Status: DC
Start: 2015-11-21 — End: 2015-11-22
  Administered 2015-11-21 – 2015-11-22 (×2): 300 mg via ORAL
  Filled 2015-11-20 (×2): qty 1

## 2015-11-20 MED ORDER — SODIUM CHLORIDE 0.9 % IV SOLN: 250.0000 mL | INTRAVENOUS | Status: DC | PRN

## 2015-11-20 MED ORDER — PANTOPRAZOLE SODIUM 40 MG PO TBEC
40.0000 mg | DELAYED_RELEASE_TABLET | Freq: Every day | ORAL | Status: DC
  Administered 2015-11-21 – 2015-11-22 (×2): 40 mg via ORAL
  Filled 2015-11-20 (×2): qty 1

## 2015-11-20 MED ORDER — SODIUM CHLORIDE 0.9% FLUSH: 3.0000 mL | INTRAVENOUS | Status: DC | PRN

## 2015-11-20 MED ORDER — MAGNESIUM OXIDE 420 MG PO TABS: 840.0000 mg | ORAL_TABLET | Freq: Two times a day (BID) | ORAL | Status: DC

## 2015-11-20 MED ORDER — SIMVASTATIN 20 MG PO TABS
20.0000 mg | ORAL_TABLET | Freq: Every day | ORAL | Status: DC
  Administered 2015-11-20 – 2015-11-21 (×2): 20 mg via ORAL
  Filled 2015-11-20 (×2): qty 1

## 2015-11-20 MED ORDER — ACETAMINOPHEN 325 MG PO TABS
650.0000 mg | ORAL_TABLET | ORAL | Status: DC | PRN
Start: 2015-11-20 — End: 2015-11-22

## 2015-11-20 MED ORDER — MAGNESIUM OXIDE 400 (241.3 MG) MG PO TABS
800.0000 mg | ORAL_TABLET | Freq: Two times a day (BID) | ORAL | Status: DC
  Administered 2015-11-20 – 2015-11-22 (×4): 800 mg via ORAL
  Filled 2015-11-20 (×4): qty 2

## 2015-11-20 MED ORDER — LOPERAMIDE HCL 2 MG PO CAPS
2.0000 mg | ORAL_CAPSULE | Freq: Two times a day (BID) | ORAL | Status: DC
  Administered 2015-11-20 – 2015-11-22 (×4): 2 mg via ORAL
  Filled 2015-11-20 (×4): qty 1

## 2015-11-20 MED ORDER — HEPARIN SODIUM (PORCINE) 5000 UNIT/ML IJ SOLN
5000.0000 [IU] | Freq: Three times a day (TID) | INTRAMUSCULAR | Status: DC
  Administered 2015-11-20 – 2015-11-22 (×4): 5000 [IU] via SUBCUTANEOUS
  Filled 2015-11-20 (×4): qty 1

## 2015-11-20 MED ORDER — CHOLECALCIFEROL 10 MCG (400 UNIT) PO TABS
400.0000 [IU] | ORAL_TABLET | Freq: Every day | ORAL | Status: DC
  Administered 2015-11-20 – 2015-11-22 (×3): 400 [IU] via ORAL
  Filled 2015-11-20 (×5): qty 1

## 2015-11-20 MED ORDER — NITROGLYCERIN 2 % TD OINT: 1.0000 [in_us] | TOPICAL_OINTMENT | Freq: Four times a day (QID) | TRANSDERMAL | Status: DC

## 2015-11-20 MED ORDER — METOPROLOL TARTRATE 50 MG PO TABS
50.0000 mg | ORAL_TABLET | Freq: Two times a day (BID) | ORAL | Status: DC
  Administered 2015-11-21: 50 mg via ORAL
  Filled 2015-11-20 (×2): qty 1

## 2015-11-20 MED ORDER — NITROGLYCERIN 2 % TD OINT
1.0000 [in_us] | TOPICAL_OINTMENT | Freq: Once | TRANSDERMAL | Status: AC
  Administered 2015-11-20: 1 [in_us] via TOPICAL
  Filled 2015-11-20: qty 1

## 2015-11-20 MED ORDER — CALCIUM POLYCARBOPHIL 625 MG PO TABS
1250.0000 mg | ORAL_TABLET | Freq: Every day | ORAL | Status: DC
  Administered 2015-11-21 – 2015-11-22 (×2): 1250 mg via ORAL
  Filled 2015-11-20 (×4): qty 2

## 2015-11-20 MED ORDER — ASPIRIN 325 MG PO TABS
325.0000 mg | ORAL_TABLET | Freq: Every day | ORAL | Status: DC
  Administered 2015-11-21: 325 mg via ORAL
  Filled 2015-11-20: qty 1

## 2015-11-20 NOTE — ED Notes (Signed)
Pt states has been having non-radiating chest tightness for the past three weeks, states has had the flu in feb and chest tightness has been 'off and on" since then

## 2015-11-20 NOTE — ED Provider Notes (Signed)
CSN: [id number]     Arrival date & time 11/20/15  1425 History  By signing my name below, I, Rohini Rajnarayanan, attest that this documentation has been prepared under the direction and in the presence of Orlie Dakin, MD Electronically Signed: Evonnie Dawes, ED Scribe 11/20/2015 at 3:14 PM.   Chief Complaint  Patient presents with  . Chest Pain   The history is provided by the patient. No language interpreter was used.    HPI Comments: Dwayne Brown is a 75 y.o. male with a pmhx of IBS, DM, HLD, HTN, and colon cancer, and a pshx of cholecystectomy, CABG - 2002-, and colon surgery, who presents to the Emergency Department complaining of intermittent, non-radiating chest tightness and palpitations which began around 3 weeks ago and lasts between a few minutes to a few hours each time. Pt denies any exacerbating or alleviating factors. Pt denies any similarity to sx leading up to his CABG. Pt's mother passed away on 10/02/2022. The day of her funeral, pt contracted the flu and was seen in the ED. Since then, pt has been feeling increased weakness, diarrhea, and hypotension.Diarrhea resolved 1 week ago Pt's IBS causes chronic diarrhea. His last diarrheal episode was last week, however, pt's last normal BM was this morning in the form of constipation. Pt denies any chills, diaphoresis, nausea, or SOB at this time. Patient describes chest discomfort is anterior tightness nonradiating accompanied by shortness of breath presently discomfort is minimal. No treatment prior to coming here, however he did take aspirin 325 mg this morning, his usual medication. He last saw his cardiologist the [facility] 2 years ago. Tightness is intermittent lasting anywhere from a few minutes to a few hours Pt denies any smoking or excessive alcohol drinking. Pt's PCP is Dr. Tarry Kos with the [place]. Pt takes 325mg  of aspirin every day. Pt was prescribed NTG, however he states that he does not need it and has never taken it. Pt  has not seen a cardiologist in 2 years.    Past Medical History  Diagnosis Date  . Hypertension   . High cholesterol   . Cancer ([place])   . Diabetes mellitus without complication [facility])    Past Surgical History  Procedure Laterality Date  . Cholecystectomy    . Coronary artery bypass graft    . Vasectomy    . Colon surgery      mass removed from colon that tested positive for cancer   History reviewed. No pertinent family history. Social History  Substance Use Topics  . Smoking status: Never Smoker   . Smokeless tobacco: None  . Alcohol Use: Yes    Review of Systems  Respiratory: Positive for shortness of breath.   Cardiovascular: Positive for chest pain.  Allergic/Immunologic: Positive for immunocompromised state.       Diabetic  All other systems reviewed and are negative.  Allergies  Review of patient's allergies indicates no known allergies.  Home Medications   Prior to Admission medications   Medication Sig Start Date End Date Taking? Authorizing Provider  allopurinol (ZYLOPRIM) 300 MG tablet Take 300 mg by mouth daily.    Historical Provider, MD  aspirin 325 MG tablet Take 325 mg by mouth daily.    Historical Provider, MD  aspirin 81 MG tablet Take 325 mg by mouth daily.     Historical Provider, MD  diphenhydrAMINE (BENADRYL) 25 MG tablet Take 2 tablets (50 mg total) by mouth every 6 (six) hours. Take 1-2 tablets every 6  hours x 2 days, then space out to an as needed basis 12/19/11 01/18/12  Alfonzo Beers, MD  glyBURIDE (DIABETA) 5 MG tablet Take 5 mg by mouth 2 (two) times daily.    Historical Provider, MD  ibuprofen (ADVIL,MOTRIN) 200 MG tablet Take 600 mg by mouth every 6 (six) hours as needed. Patient used this medication for the pain in his shoulder and hips.    Historical Provider, MD  lisinopril (PRINIVIL,ZESTRIL) 10 MG tablet Take 10 mg by mouth daily.    Historical Provider, MD  losartan (COZAAR) 50 MG tablet Take 100 mg by mouth daily.     Historical Provider,  MD  Magnesium 400 MG TABS Take 800 mg by mouth 2 (two) times daily.    Historical Provider, MD  metFORMIN (GLUCOPHAGE) 1000 MG tablet Take 500 mg by mouth 2 (two) times daily.     Historical Provider, MD  metoprolol (LOPRESSOR) 50 MG tablet Take 50 mg by mouth 2 (two) times daily.    Historical Provider, MD  naproxen (NAPROSYN) 500 MG tablet Take 1 tablet (500 mg total) by mouth 2 (two) times daily as needed for moderate pain. 04/18/14   Tanna Furry, MD  oseltamivir (TAMIFLU) 75 MG capsule Take 1 capsule (75 mg total) by mouth every 12 (twelve) hours. 10/03/15   John Molpus, MD  predniSONE (DELTASONE) 50 MG tablet Take 1 tablet (50 mg total) by mouth daily. 12/19/11   Alfonzo Beers, MD  simvastatin (ZOCOR) 20 MG tablet Take 20 mg by mouth every evening.    Historical Provider, MD  Tamsulosin HCl (FLOMAX) 0.4 MG CAPS Take 0.4 mg by mouth.    Historical Provider, MD   BP 144/61 mmHg  Pulse 66  Temp(Src) 98.6 F (37 C) (Oral)  Resp 20  Ht 5\' 10"  (1.778 m)  Wt 245 lb (111.131 kg)  BMI 35.15 kg/m2  SpO2 100% Physical Exam  Constitutional: He is oriented to person, place, and time. He appears well-developed and well-nourished.  HENT:  Head: Normocephalic and atraumatic.  Eyes: Conjunctivae are normal. Pupils are equal, round, and reactive to light.  Neck: Neck supple. No tracheal deviation present. No thyromegaly present.  Cardiovascular: Normal rate and regular rhythm.   No murmur heard. Pulmonary/Chest: Effort normal and breath sounds normal.  Abdominal: Soft. Bowel sounds are normal. He exhibits no distension. There is no tenderness.  Musculoskeletal: Normal range of motion. He exhibits no edema or tenderness.  Neurological: He is alert and oriented to person, place, and time. Coordination normal.  Skin: Skin is warm and dry. No rash noted.  Psychiatric: He has a normal mood and affect.  Nursing note and vitals reviewed.   ED Course  Procedures  DIAGNOSTIC STUDIES: Oxygen Saturation is  100% on RA, normal by my interpretation.    COORDINATION OF CARE: 2:59 PM-Discussed treatment plan which includes NTG, DG chest, blood work, EKG, and troponin  with pt at bedside and pt agreed to plan.   Labs Review Labs Reviewed  CBC WITH DIFFERENTIAL/PLATELET - Abnormal; Notable for the following:    RBC 3.77 (*)    Hemoglobin 12.1 (*)    HCT 36.2 (*)    Platelets 125 (*)    All other components within normal limits  CBG MONITORING, ED - Abnormal; Notable for the following:    Glucose-Capillary 270 (*)    All other components within normal limits  COMPREHENSIVE METABOLIC PANEL  TROPONIN I    Imaging Review Dg Chest 2 View  11/20/2015  CLINICAL  DATA:  Central chest tightness for 2-3 weeks, nonradiating, increased weakness, hypertension, diabetes mellitus, former smoker, post CABG, recent flu about the time his symptoms started EXAM: CHEST  2 VIEW COMPARISON:  10/03/2015 FINDINGS: Enlargement of cardiac silhouette post CABG. Mediastinal contours and pulmonary vascularity normal. Lungs clear. No pleural effusion or pneumothorax. Bones unremarkable. IMPRESSION: Enlargement of cardiac silhouette post CABG. No acute abnormalities. Electronically Signed   By: Lavonia Dana M.D.   On: 11/20/2015 15:30   I have personally reviewed and evaluated these images and lab results as part of my medical decision-making.   EKG Interpretation   Date/Time:  Sunday November 20 2015 14:30:07 EDT Ventricular Rate:  63 PR Interval:  202 QRS Duration: 100 QT Interval:  414 QTC Calculation: 423 R Axis:   8 Text Interpretation:  Unusual P axis, possible ectopic atrial rhythm  Incomplete right bundle branch block Abnormal ECG No significant change  since last tracing Confirmed by KNAPP  MD-J, JON [id number]) on 11/20/2015  2:33:39 PM     4:25 PM patient remains with minimal tightness at anterior chest. Otherwise asymptomatic. He appears comfortable. Chest x-ray viewed by me Results for orders placed or  performed during the hospital encounter of 11/20/15  CBC with Differential  Result Value Ref Range   WBC 5.2 4.0 - 10.5 K/uL   RBC 3.77 (L) 4.22 - 5.81 MIL/uL   Hemoglobin 12.1 (L) 13.0 - 17.0 g/dL   HCT 36.2 (L) 39.0 - 52.0 %   MCV 96.0 78.0 - 100.0 fL   MCH 32.1 26.0 - 34.0 pg   MCHC 33.4 30.0 - 36.0 g/dL   RDW 14.4 11.5 - 15.5 %   Platelets 125 (L) 150 - 400 K/uL   Neutrophils Relative % 76 %   Neutro Abs 3.9 1.7 - 7.7 K/uL   Lymphocytes Relative 16 %   Lymphs Abs 0.8 0.7 - 4.0 K/uL   Monocytes Relative 7 %   Monocytes Absolute 0.4 0.1 - 1.0 K/uL   Eosinophils Relative 1 %   Eosinophils Absolute 0.1 0.0 - 0.7 K/uL   Basophils Relative 0 %   Basophils Absolute 0.0 0.0 - 0.1 K/uL  Comprehensive metabolic panel  Result Value Ref Range   Sodium 136 135 - 145 mmol/L   Potassium 4.2 3.5 - 5.1 mmol/L   Chloride 104 101 - 111 mmol/L   CO2 23 22 - 32 mmol/L   Glucose, Bld 266 (H) 65 - 99 mg/dL   BUN 20 6 - 20 mg/dL   Creatinine, Ser 1.31 (H) 0.61 - 1.24 mg/dL   Calcium 8.9 8.9 - 10.3 mg/dL   Total Protein 6.6 6.5 - 8.1 g/dL   Albumin 3.8 3.5 - 5.0 g/dL   AST 35 15 - 41 U/L   ALT 36 17 - 63 U/L   Alkaline Phosphatase 64 38 - 126 U/L   Total Bilirubin 0.5 0.3 - 1.2 mg/dL   GFR calc non Af Amer 52 (L) >60 mL/min   GFR calc Af Amer >60 >60 mL/min   Anion gap 9 5 - 15  Troponin I  Result Value Ref Range   Troponin I <0.03 <0.031 ng/mL  POC CBG, ED  Result Value Ref Range   Glucose-Capillary 270 (H) 65 - 99 mg/dL   Dg Chest 2 View  11/20/2015  CLINICAL DATA:  Central chest tightness for 2-3 weeks, nonradiating, increased weakness, hypertension, diabetes mellitus, former smoker, post CABG, recent flu about the time his symptoms started EXAM: CHEST  2  VIEW COMPARISON:  10/03/2015 FINDINGS: Enlargement of cardiac silhouette post CABG. Mediastinal contours and pulmonary vascularity normal. Lungs clear. No pleural effusion or pneumothorax. Bones unremarkable. IMPRESSION: Enlargement of  cardiac silhouette post CABG. No acute abnormalities. Electronically Signed   By: Lavonia Dana M.D.   On: 11/20/2015 15:30  chest xray viewed by me  MDM  Patient's heart score 5 based on risk factors, age, EKG criteria, story is only slightly suspicious for acute coronary syndrome. Final diagnoses:  None   Dr.Singh consulted. Plan transfer to [facility] 23 hour observation, telemetry. Diagnosis #1 chest pain #2 hyperglycemia #3 renal insufficiency I personally performed the services described in this documentation, which was scribed in my presence. The recorded information has been reviewed and considered.      Orlie Dakin, MD 11/20/15 [phone]

## 2015-11-20 NOTE — H&P (Signed)
[facility] History and Physical  Dwayne Brown [id number] DOB: 02-Jan-1941 DOA: 11/20/2015  Referring physician: ED physician PCP: No primary care provider on file.  Specialists: [employer]   Chief Complaint:  Chest pain   HPI: Dwayne Brown is a 75 y.o. male with PMH of hypertension, hyperlipidemia, non-insulin-dependent type 2 diabetes mellitus, chronic kidney disease stage III, and CAD status post 5 vessel CABG in 2002 who presents to the ED with intermittent, nonradiating chest pain of 3 weeks duration. Patient reports that he has not been in his usual state of health since having a flulike illness in February of this year. While his URI symptoms and malaise have long since resolved, he continues to have a vague and intermittent "tightness" across the anterior chest. He takes a daily aspirin 325 mg, and while he has a prescription for nitroglycerin, has never taken it. He had been following with cardiology at the [facility], but it is been approximately 2 years since last visit. He continues to follow with his primary care physician and has been maintained on Lopressor and Zocor in addition to the daily aspirin. Chest pain is difficult for the patient to describe, mild-moderate in intensity, "tightness" in character, with no appreciable alleviating or exacerbating factors identified. There is no associated dyspnea, diaphoresis, or nausea. Of note, patient has been under considerable psychosocial stress since February of this year when his mother died. Patient had an episode prior to his arrival which was worse than priors, prompting his presentation to the [place] emergency department.  In the [facility] ED, patient was found to be afebrile, saturating well on room air, and with vital signs stable. He had taken a 325 mg aspirin earlier in the day. 1 inch of Nitropaste was placed with no appreciable change in the patient's symptoms. Eventually, we'll still in the ED, the pain  subsided. He was given a dose of Lopressor by mouth and 40 mg Zocor. Transfer to [facility] for ongoing observation was requested and accepted.  The patient is interviewed and examined on the telemetry unit at [facility] where he continues to be free of significant chest pain, is afebrile, saturating well on room air, and with vital signs stable. He will be observed overnight with serial measurement of cardiac biomarkers and repeat EKG. Cardiology has been asked to evaluate the patient in the morning.  Where does patient live?   At home     Can patient participate in ADLs?  Yes     Review of Systems:   General: no fevers, chills, sweats, weight change, poor appetite, or fatigue HEENT: no blurry vision, hearing changes or sore throat Pulm: no dyspnea, cough, or wheeze CV: no palpitations. Chest tightness.  Abd: no nausea, vomiting, abdominal pain, diarrhea, or constipation GU: no dysuria, hematuria, increased urinary frequency, or urgency  Ext: no leg edema Neuro: no focal weakness, numbness, or tingling, no vision change or hearing loss Skin: no rash, no wounds MSK: No muscle spasm, no deformity, no red, hot, or swollen joint Heme: No easy bruising or bleeding Travel history: No recent long distant travel    Allergy: No Known Allergies  Past Medical History  Diagnosis Date  . Essential hypertension   . High cholesterol   . Cancer ([place])   . Diabetes mellitus without complication ([place])   . CAD (coronary artery disease) of bypass graft     Past Surgical History  Procedure Laterality Date  . Cholecystectomy    . Coronary artery  bypass graft    . Vasectomy    . Colon surgery      mass removed from colon that tested positive for cancer    Social History:  reports that he has never smoked. He does not have any smokeless tobacco history on file. He reports that he drinks alcohol. He reports that he does not use illicit drugs.  Family History: History reviewed. No pertinent family  history.   Prior to Admission medications   Medication Sig Start Date End Date Taking? Authorizing Provider  allopurinol (ZYLOPRIM) 300 MG tablet Take 300 mg by mouth daily.   Yes Historical Provider, MD  aspirin 325 MG tablet Take 325 mg by mouth at bedtime.    Yes Historical Provider, MD  CALCIUM POLYCARBOPHIL PO Take 1,250 mg by mouth daily. 625 mg per tablet   Yes Historical Provider, MD  Cholecalciferol (VITAMIN D PO) Take 1 tablet by mouth at bedtime.   Yes Historical Provider, MD  glipiZIDE (GLUCOTROL) 10 MG tablet Take 10 mg by mouth 2 (two) times daily before a meal.   Yes Historical Provider, MD  ibuprofen (ADVIL,MOTRIN) 200 MG tablet Take 600 mg by mouth every 6 (six) hours as needed (pain).    Yes Historical Provider, MD  loperamide (IMODIUM) 2 MG capsule Take 2 mg by mouth 2 (two) times daily.   Yes Historical Provider, MD  Magnesium Oxide 420 MG TABS Take 840 mg by mouth 2 (two) times daily.   Yes Historical Provider, MD  metoprolol (LOPRESSOR) 50 MG tablet Take 50 mg by mouth 2 (two) times daily.   Yes Historical Provider, MD  omeprazole (PRILOSEC) 20 MG capsule Take 20 mg by mouth daily.   Yes Historical Provider, MD  Polyvinyl Alcohol-Povidone (REFRESH OP) Place 1 drop into both eyes daily as needed (dry eyes).   Yes Historical Provider, MD  saxagliptin HCl (ONGLYZA) 5 MG TABS tablet Take 5 mg by mouth at bedtime.   Yes Historical Provider, MD  simvastatin (ZOCOR) 20 MG tablet Take 20 mg by mouth at bedtime.    Yes Historical Provider, MD  oseltamivir (TAMIFLU) 75 MG capsule Take 1 capsule (75 mg total) by mouth every 12 (twelve) hours. Patient not taking: Reported on 11/20/2015 10/03/15   Shanon Rosser, MD    Physical Exam: Filed Vitals:   11/20/15 1630 11/20/15 1716 11/20/15 1730 11/20/15 1828  BP: 139/74 121/70 139/81 130/74  Pulse: 62 58 60 65  Temp:    97.7 F (36.5 C)  TempSrc:    Oral  Resp: 9 16 14 16   Height:    5\' 10"  (1.778 m)  Weight:    109 kg (240 lb 4.8 oz)   SpO2: 97% 98% 98% 98%   General: Not in acute distress HEENT:       Eyes: PERRL, EOMI, no scleral icterus or conjunctival pallor.       ENT: No discharge from the ears or nose, no pharyngeal ulcers, oral mucosa moist.        Neck: No JVD, no bruit, no appreciable mass Heme: No cervical adenopathy, no pallor Cardiac: S1/S2, RRR, No murmurs, No gallops or rubs. Pulm: Good air movement bilaterally. No rales, wheezing, rhonchi or rubs. Abd: Soft, nondistended, nontender, no rebound pain or gaurding, BS present. Ext: No LE edema bilaterally. 2+DP/PT pulse bilaterally. Musculoskeletal: No gross deformity, no red, hot, swollen joints  Skin: No rashes or wounds on exposed surfaces  Neuro: Alert, oriented X3, cranial nerves II-XII grossly intact. No focal findings  Psych: Patient is not overtly psychotic, appropriate mood and affect.  Labs on Admission:  Basic Metabolic Panel:  Recent Labs Lab 11/20/15 1501  NA 136  K 4.2  CL 104  CO2 23  GLUCOSE 266*  BUN 20  CREATININE 1.31*  CALCIUM 8.9   Liver Function Tests:  Recent Labs Lab 11/20/15 1501  AST 35  ALT 36  ALKPHOS 64  BILITOT 0.5  PROT 6.6  ALBUMIN 3.8   No results for input(s): LIPASE, AMYLASE in the last 168 hours. No results for input(s): AMMONIA in the last 168 hours. CBC:  Recent Labs Lab 11/20/15 1501  WBC 5.2  NEUTROABS 3.9  HGB 12.1*  HCT 36.2*  MCV 96.0  PLT 125*   Cardiac Enzymes:  Recent Labs Lab 11/20/15 1501 11/20/15 1725  TROPONINI <0.03 <0.03    BNP (last 3 results) No results for input(s): BNP in the last 8760 hours.  ProBNP (last 3 results) No results for input(s): PROBNP in the last 8760 hours.  CBG:  Recent Labs Lab 11/20/15 1440  GLUCAP 270*    Radiological Exams on Admission: Dg Chest 2 View  11/20/2015  CLINICAL DATA:  Central chest tightness for 2-3 weeks, nonradiating, increased weakness, hypertension, diabetes mellitus, former smoker, post CABG, recent flu about  the time his symptoms started EXAM: CHEST  2 VIEW COMPARISON:  10/03/2015 FINDINGS: Enlargement of cardiac silhouette post CABG. Mediastinal contours and pulmonary vascularity normal. Lungs clear. No pleural effusion or pneumothorax. Bones unremarkable. IMPRESSION: Enlargement of cardiac silhouette post CABG. No acute abnormalities. Electronically Signed   By: Lavonia Dana M.D.   On: 11/20/2015 15:30    EKG: Independently reviewed.  Abnormal findings:  Sinus rhythm, incomplete RBBB, no significant change from priors   Assessment/Plan  1. Chest pain  - Atypical for ischemic coronary pain, not improved with NTG, but spontaneously resolving  - First two troponin measurements <0.03  - EKG with incomplete RBBB and p-wave abnormality that is not significantly changed relative to priors  - Monitor on telemetry for ischemic changes  - Serial troponin measurements  - Repeat EKG in am, sooner for pain recurrence  - ASA 325 mg taken at home pta  - 1" nitro paste placed in ED  - Lopressor given PO  - Zocor 40 mg given   2. CAD    - 5v CABG in 2002 by Dr. Prescott Gum  - Follows with cardiology through the [facility]  - Evaluating for possible ACS as above  - Continue ASA, statin, beta-blocker  - Check fasting lipids and A1c    3. CKD stage III - SCr 1.31 on admission, slightly up from his apparent baseline of 1.2 - Appears to be euvolemic on arrival   - Trend    4. Hypertension  - At goal currently  - Continue Lopressor BID   5. Hyperlipidemia  - No lipid panel on file, will check fasting lipids in am  - Continue current dose Zocor    6. Type II DM  - Using glipizide and saxagliptin at home  - No A1c in EMR, will order  - Hold home agents while admitted  - Check CBG with meals and qHS  - Low-intensity SSI correctional, adjust prn  - Carb-modified diet when appropriate   7. Normocytic anemia, mild thrombocytopenia  - Hgb is 12.1 with normal MCV and stable relative to priors  - Platelet count  is 125,000, consistent with priors  - No sign of active blood loss  DVT ppx: SQ Heparin     Code Status: Full code Family Communication: None at bed side.                Disposition Plan: Admit to inpatient   Date of Service 11/20/2015    Vianne Bulls, MD [facility] Pager [phone]  If 7PM-7AM, please contact night-coverage [email or web address] Password [facility] 11/20/2015, 8:54 PM

## 2015-11-20 NOTE — ED Notes (Signed)
Pt placed on automatic VS, continuous pulse ox and cardiac monitoring. 

## 2015-11-20 NOTE — ED Notes (Signed)
Family at bedside. 

## 2015-11-21 ENCOUNTER — Observation Stay (HOSPITAL_COMMUNITY): Payer: Medicare Other

## 2015-11-21 DIAGNOSIS — E119 Type 2 diabetes mellitus without complications: Secondary | ICD-10-CM | POA: Diagnosis not present

## 2015-11-21 DIAGNOSIS — I471 Supraventricular tachycardia: Secondary | ICD-10-CM

## 2015-11-21 DIAGNOSIS — I10 Essential (primary) hypertension: Secondary | ICD-10-CM

## 2015-11-21 DIAGNOSIS — I25709 Atherosclerosis of coronary artery bypass graft(s), unspecified, with unspecified angina pectoris: Secondary | ICD-10-CM

## 2015-11-21 DIAGNOSIS — R079 Chest pain, unspecified: Secondary | ICD-10-CM | POA: Diagnosis not present

## 2015-11-21 DIAGNOSIS — I129 Hypertensive chronic kidney disease with stage 1 through stage 4 chronic kidney disease, or unspecified chronic kidney disease: Secondary | ICD-10-CM | POA: Diagnosis not present

## 2015-11-21 DIAGNOSIS — R0789 Other chest pain: Secondary | ICD-10-CM | POA: Diagnosis not present

## 2015-11-21 DIAGNOSIS — N183 Chronic kidney disease, stage 3 (moderate): Secondary | ICD-10-CM

## 2015-11-21 LAB — GLUCOSE, CAPILLARY
GLUCOSE-CAPILLARY: 296 mg/dL — AB (ref 65–99)
GLUCOSE-CAPILLARY: 211 mg/dL — AB (ref 65–99)
Glucose-Capillary: 226 mg/dL — ABNORMAL HIGH (ref 65–99)

## 2015-11-21 LAB — NM MYOCAR SINGLE W/SPECT
CHL CUP NUCLEAR SRS: 6
CHL CUP NUCLEAR SSS: 6
CSEPED: 0 min
CSEPEDS: 0 s
Estimated workload: 1 METS
LHR: 0.4
LVDIAVOL: 53 mL (ref 62–150)
LVSYSVOL: 18 mL
MPHR: 146 {beats}/min
NUC STRESS TID: 0.97
Peak HR: 137 {beats}/min
Percent HR: 93 %
RPE: 0
Rest HR: 72 {beats}/min
SDS: 0

## 2015-11-21 LAB — T4, FREE: FREE T4: 0.81 ng/dL (ref 0.61–1.12)

## 2015-11-21 LAB — TROPONIN I

## 2015-11-21 MED ORDER — REGADENOSON 0.4 MG/5ML IV SOLN
0.4000 mg | Freq: Once | INTRAVENOUS | Status: AC
  Administered 2015-11-21: 0.4 mg via INTRAVENOUS
  Filled 2015-11-21: qty 5

## 2015-11-21 MED ORDER — METOPROLOL TARTRATE 50 MG PO TABS
75.0000 mg | ORAL_TABLET | Freq: Two times a day (BID) | ORAL | Status: DC
  Administered 2015-11-21 – 2015-11-22 (×2): 75 mg via ORAL
  Filled 2015-11-21 (×2): qty 1

## 2015-11-21 MED ORDER — TECHNETIUM TC 99M SESTAMIBI GENERIC - CARDIOLITE
10.0000 | Freq: Once | INTRAVENOUS | Status: AC | PRN
  Administered 2015-11-21: 10 via INTRAVENOUS

## 2015-11-21 MED ORDER — INSULIN ASPART 100 UNIT/ML ~~LOC~~ SOLN
0.0000 [IU] | Freq: Three times a day (TID) | SUBCUTANEOUS | Status: DC
  Administered 2015-11-21: 3 [IU] via SUBCUTANEOUS
  Administered 2015-11-22: 2 [IU] via SUBCUTANEOUS

## 2015-11-21 MED ORDER — REGADENOSON 0.4 MG/5ML IV SOLN
INTRAVENOUS | Status: AC
  Filled 2015-11-21: qty 5

## 2015-11-21 MED ORDER — METOPROLOL TARTRATE 25 MG PO TABS
25.0000 mg | ORAL_TABLET | Freq: Once | ORAL | Status: AC
  Administered 2015-11-21: 25 mg via ORAL
  Filled 2015-11-21: qty 1

## 2015-11-21 MED ORDER — TECHNETIUM TC 99M SESTAMIBI GENERIC - CARDIOLITE
30.0000 | Freq: Once | INTRAVENOUS | Status: AC | PRN
  Administered 2015-11-21: 30 via INTRAVENOUS

## 2015-11-21 NOTE — Consult Note (Signed)
CARDIOLOGY CONSULT NOTE   Patient ID: Dwayne Brown MRN: [id number] DOB/AGE: [birth year] 75 y.o.  Admit date: 11/20/2015  Primary Physician   No primary care provider on file. Primary Cardiologist   [facility] (last seen 2 years ago) Reason for Consultation   Chest pain Requesting Physician  Dr. Tyrell Antonio  HPI: Dwayne Brown is a 75 y.o. male with a history of CAD s/p CABG, HTN, DM and HL who presented to [facility] ED 11/20/15 for evaluation of "chest fluttering".   Hx of CAD s/p CABG x 5 2002 (left internal mammary artery to left anterior descending, saphenous vein graft to diagonal, sequential saphenous vein graft to obtuse marginal 1 and obtuse marginal 2, saphenous vein graft to posterior descending coronary artery). Since then followed by cardiologist at [facility]. No intervention. Last seen by cardiologist approximately 2 years ago.   The patient was in [place] up until 3-4 weeks ago when he had a flu/URI like illness. States resolution of URI s/s however since then developed intermittent "chest tightness along with fluttering sensation". Each episode lasting for few seconds to 5-10 minutes. Noting makes it worse. Bending makes it better. This has been getting worse leading to ER presentation at [facility] for further evaluation. The patient denies nausea, vomiting, fever, orthopnea, PND, dizziness, syncope, cough, congestion, abdominal pain, hematochezia, melena, lower extremity edema. Walks 15 mins 4-5 times/week without chest pain. Under lots of stress. Mother died 15 this year. Drink decaffeinated coffee.   In ER, his EKG showed sinus rhythm with PACs and incomplete RBBB. Repeat EKG this morning showed atrial tachycardia. Tele showed multiple short runs of SVT since admission and patient did felt it. Also noted during stress test.  Troponin X 4 negative. BNP 53. Normal Free T4. CXR without acute abnormality.   Past Medical History  Diagnosis Date  . Essential hypertension   .  High cholesterol   . Cancer ([place])   . Diabetes mellitus without complication ([place])   . CAD (coronary artery disease) of bypass graft      Past Surgical History  Procedure Laterality Date  . Cholecystectomy    . Coronary artery bypass graft    . Vasectomy    . Colon surgery      mass removed from colon that tested positive for cancer    No Known Allergies  I have reviewed the patient's current medications . allopurinol  300 mg Oral Daily  . aspirin  325 mg Oral QHS  . cholecalciferol  400 Units Oral Daily  . heparin  5,000 Units Subcutaneous 3 times per day  . loperamide  2 mg Oral BID  . magnesium oxide  800 mg Oral BID  . metoprolol tartrate  50 mg Oral BID  . pantoprazole  40 mg Oral Daily  . polycarbophil  1,250 mg Oral Daily  . regadenoson      . simvastatin  20 mg Oral QHS  . sodium chloride flush  3 mL Intravenous Q12H     sodium chloride, acetaminophen, nitroGLYCERIN, ondansetron (ZOFRAN) IV, sodium chloride flush  Prior to Admission medications   Medication Sig Start Date End Date Taking? Authorizing Provider  allopurinol (ZYLOPRIM) 300 MG tablet Take 300 mg by mouth daily.   Yes Historical Provider, MD  aspirin 325 MG tablet Take 325 mg by mouth at bedtime.    Yes Historical Provider, MD  CALCIUM POLYCARBOPHIL PO Take 1,250 mg by mouth daily. 625 mg per tablet   Yes Historical Provider,  MD  Cholecalciferol (VITAMIN D PO) Take 1 tablet by mouth at bedtime.   Yes Historical Provider, MD  glipiZIDE (GLUCOTROL) 10 MG tablet Take 10 mg by mouth 2 (two) times daily before a meal.   Yes Historical Provider, MD  ibuprofen (ADVIL,MOTRIN) 200 MG tablet Take 600 mg by mouth every 6 (six) hours as needed (pain).    Yes Historical Provider, MD  loperamide (IMODIUM) 2 MG capsule Take 2 mg by mouth 2 (two) times daily.   Yes Historical Provider, MD  Magnesium Oxide 420 MG TABS Take 840 mg by mouth 2 (two) times daily.   Yes Historical Provider, MD  metoprolol (LOPRESSOR) 50 MG  tablet Take 50 mg by mouth 2 (two) times daily.   Yes Historical Provider, MD  omeprazole (PRILOSEC) 20 MG capsule Take 20 mg by mouth daily.   Yes Historical Provider, MD  Polyvinyl Alcohol-Povidone (REFRESH OP) Place 1 drop into both eyes daily as needed (dry eyes).   Yes Historical Provider, MD  saxagliptin HCl (ONGLYZA) 5 MG TABS tablet Take 5 mg by mouth at bedtime.   Yes Historical Provider, MD  simvastatin (ZOCOR) 20 MG tablet Take 20 mg by mouth at bedtime.    Yes Historical Provider, MD  oseltamivir (TAMIFLU) 75 MG capsule Take 1 capsule (75 mg total) by mouth every 12 (twelve) hours. Patient not taking: Reported on 11/20/2015 10/03/15   Shanon Rosser, MD     Social History   Social History  . Marital Status: Divorced    Spouse Name: N/A  . Number of Children: N/A  . Years of Education: N/A   Occupational History  . Not on file.   Social History Main Topics  . Smoking status: Never Smoker   . Smokeless tobacco: Not on file  . Alcohol Use: Yes  . Drug Use: No  . Sexual Activity: Not on file   Other Topics Concern  . Not on file   Social History Narrative    Family Hx  - The patient denies any family hx of stroke, cardiac disease or arrhythmiaa.   ROS:  Full 14 point review of systems complete and found to be negative unless listed above.  Physical Exam: Blood pressure 145/58, pulse 72, temperature 98.1 F (36.7 C), temperature source Oral, resp. rate 18, height 5\' 10"  (1.778 m), weight 239 lb 4.8 oz (108.546 kg), SpO2 96 %.  General: Well developed, well nourished, male in no acute distress Head: Eyes PERRLA, No xanthomas. Normocephalic and atraumatic, oropharynx without edema or exudate.  Lungs: Resp regular and unlabored, CTA. Heart: Irregular  no s3, s4, or systolic murmurs?.   Neck: No carotid bruits. No lymphadenopathy.  No JVD. Abdomen: Bowel sounds present, abdomen soft and non-tender without masses or hernias noted. Msk:  No spine or cva tenderness. No  weakness, no joint deformities or effusions. Extremities: No clubbing, cyanosis or edema. DP/PT/Radials 2+ and equal bilaterally. Neuro: Alert and oriented X 3. No focal deficits noted. Psych:  Good affect, responds appropriately Skin: No rashes or lesions noted.  Labs:   Lab Results  Component Value Date   WBC 5.2 11/20/2015   HGB 12.1* 11/20/2015   HCT 36.2* 11/20/2015   MCV 96.0 11/20/2015   PLT 125* 11/20/2015   No results for input(s): INR in the last 72 hours.  Recent Labs Lab 11/20/15 1501  NA 136  K 4.2  CL 104  CO2 23  BUN 20  CREATININE 1.31*  CALCIUM 8.9  PROT 6.6  BILITOT  0.5  ALKPHOS 64  ALT 36  AST 35  GLUCOSE 266*  ALBUMIN 3.8   No results found for: MG  Recent Labs  11/20/15 1501 11/20/15 1725 11/20/15 2145 11/21/15 0255  TROPONINI <0.03 <0.03 <0.03 <0.03    ECG:  Vent. rate 107 BPM PR interval * ms QRS duration 104 ms QT/QTc 358/477 ms P-R-T axes * 32 18  Radiology:  Dg Chest 2 View  11/20/2015  CLINICAL DATA:  Central chest tightness for 2-3 weeks, nonradiating, increased weakness, hypertension, diabetes mellitus, former smoker, post CABG, recent flu about the time his symptoms started EXAM: CHEST  2 VIEW COMPARISON:  10/03/2015 FINDINGS: Enlargement of cardiac silhouette post CABG. Mediastinal contours and pulmonary vascularity normal. Lungs clear. No pleural effusion or pneumothorax. Bones unremarkable. IMPRESSION: Enlargement of cardiac silhouette post CABG. No acute abnormalities. Electronically Signed   By: Lavonia Dana M.D.   On: 11/20/2015 15:30    ASSESSMENT AND PLAN:     1. Sinus arrhythmia - EKG on admission showed sinus rhythm with PACs and incomplete RBBB. Repeat EKG this morning showed atrial tachycardia. Tele showed multiple short runs of SVT since admission and patient did felt it. Also noted during stress test.  Troponin X 4 negative. BNP 53. Normal Free T4. CXR without acute abnormality.  - Seems his chest tightness likely  due to SVT. He has ruled out. Pending Myoview result.  - He is on metoprolol 50mg  BID. BP is relatively stable. Consider increasing dose to 75mg  BID and will get EP consult. He will f/u @ [place] after discharge.  2. Chest tightness with hx of CAD s/p CABG x 5 (2002) - As above. No anginal pain.     Otherwise per primary:    Chest pain   Diabetes mellitus without complication (HCC)   High cholesterol   Essential hypertension   CAD (coronary artery disease) of bypass graft   Chronic kidney disease (CKD), stage III (moderate)   Normocytic anemia   Thrombocytopenia ([place])   Signed: Bhagat,Bhavinkumar, PA 11/21/2015, 10:56 AM Pager [phone]  Co-Sign MD  I have examined the patient and reviewed assessment and plan and discussed with patient.  Agree with above as stated.  SVT noted at the time of his sx.  Increase metoprolol.  Will have EP see to determine if ablation would be reasonable since there is not much more room to increase rate control meds.  Await Lexiscan result.  I don;t think his sx were ischemic.  He was not having any exertional angina prior to coming to the hospital.  I think his sx were more related to the SVT.   Shabana Armentrout S.

## 2015-11-21 NOTE — Progress Notes (Addendum)
Inpatient Diabetes Program Recommendations  AACE/ADA: New Consensus Statement on Inpatient Glycemic Control (2015)  Target Ranges:  Prepandial:   less than 140 mg/dL      Peak postprandial:   less than 180 mg/dL (1-2 hours)      Critically ill patients:  140 - 180 mg/dL  Results for JOAN, SAHNI (MRN [id number]) as of 11/21/2015 10:37  Ref. Range 11/20/2015 15:01  Glucose Latest Ref Range: 65-99 mg/dL 266 (H)  Results for JAEVEN, PAMPHILE (MRN [id number]) as of 11/21/2015 10:37  Ref. Range 11/20/2015 14:40  Glucose-Capillary Latest Ref Range: 65-99 mg/dL 270 (H)   Review of Glycemic Control  Diabetes history: DM2 Outpatient Diabetes medications: Glipizide 10 mg BID, Onglyza 5 mg QHS Current orders for Inpatient glycemic control: None  Inpatient Diabetes Program Recommendations: Correction (SSI): Please order CBGs with Novolog correction scale Q4H (once diet is resumed, please change frequency to ACHS). HgbA1C: Please consider ordeirng an A1C to evaluate glycemic control over the past 2-3 months.   Thanks, Barnie Alderman, RN, MSN, CDE Diabetes Coordinator Inpatient Diabetes Program [phone] (Team Pager from [place] to [place]) [phone] ([facility] office) [phone] [facility] office) [phone] [facility] office)

## 2015-11-21 NOTE — Consult Note (Signed)
ELECTROPHYSIOLOGY CONSULT NOTE    Patient ID: MARSHAWN CLINE MRN: [id number], DOB/AGE: 74-12-1940 75 y.o.  Admit date: 11/20/2015 Date of Consult: 11/21/2015  Primary Physician: No primary care provider on file. Primary Cardiologist: [facility], Dr. [facility] Rue (not seen in 2 years)  Reason for Consultation: A. tachycardia  HPI: BERTRAND BROSNAN is a 75 y.o. male CAD s/p CABG, HTN, DM, Lymphoma treated with chemo and surgery (2 years ago) and HL who presented to [facility] ED 11/20/15 for evaluation of "chest fluttering".  He was seen originally  [facility] and transferred here for further evaluation.  Hx of CAD s/p CABG x 5 2002 (left internal mammary artery to left anterior descending, saphenous vein graft to diagonal, sequential saphenous vein graft to obtuse marginal 1 and obtuse marginal 2, saphenous vein graft to posterior descending coronary artery). Since then followed by cardiologist at [facility]. No intervention. Last seen by cardiologist approximately 2 years ago.  Labs noted: K+ 4.2, BUN/Creat 20/1.31 Trop <0.03 x4 Free T4 0.81  In the last 3 weeks he has been feeling racing heart beats associated with CP, he had the flu 3 weeks ago given atrovent that he would use and Tamiflu that he did not.  He feels like he has completely recovered from this.  Though the timing of the onset of his palpitations were with the onset of this illness, though despite feeling like he is better his palpitations continue.  He has had a couple episodes of lightheadedness that he felt like he had to sit for and put his head down, no syncope.  He did have diarrhea and poor PO intake with his flu illness.  The palpitations can last a few seconds to an hour in duration. No SOB, no cough, no PND.   Past Medical History  Diagnosis Date  . Essential hypertension   . High cholesterol   . Cancer ([place])   . Diabetes mellitus without complication ([place])   . CAD (coronary artery disease) of bypass graft       Surgical History:  Past Surgical History  Procedure Laterality Date  . Cholecystectomy    . Coronary artery bypass graft    . Vasectomy    . Colon surgery      mass removed from colon that tested positive for cancer     Prescriptions prior to admission  Medication Sig Dispense Refill Last Dose  . allopurinol (ZYLOPRIM) 300 MG tablet Take 300 mg by mouth daily.   11/20/2015 at Unknown time  . aspirin 325 MG tablet Take 325 mg by mouth at bedtime.    11/19/2015 at Unknown time  . CALCIUM POLYCARBOPHIL PO Take 1,250 mg by mouth daily. 625 mg per tablet   11/20/2015 at Unknown time  . Cholecalciferol (VITAMIN D PO) Take 1 tablet by mouth at bedtime.   11/19/2015 at Unknown time  . glipiZIDE (GLUCOTROL) 10 MG tablet Take 10 mg by mouth 2 (two) times daily before a meal.   11/20/2015 at am  . ibuprofen (ADVIL,MOTRIN) 200 MG tablet Take 600 mg by mouth every 6 (six) hours as needed (pain).    month ago  . loperamide (IMODIUM) 2 MG capsule Take 2 mg by mouth 2 (two) times daily.   11/20/2015 at am  . Magnesium Oxide 420 MG TABS Take 840 mg by mouth 2 (two) times daily.   11/20/2015 at am  . metoprolol (LOPRESSOR) 50 MG tablet Take 50 mg by mouth 2 (two) times daily.  11/20/2015 at 900  . omeprazole (PRILOSEC) 20 MG capsule Take 20 mg by mouth daily.   11/20/2015 at Unknown time  . Polyvinyl Alcohol-Povidone (REFRESH OP) Place 1 drop into both eyes daily as needed (dry eyes).   week ago  . saxagliptin HCl (ONGLYZA) 5 MG TABS tablet Take 5 mg by mouth at bedtime.   11/19/2015 at Unknown time  . simvastatin (ZOCOR) 20 MG tablet Take 20 mg by mouth at bedtime.    11/19/2015 at Unknown time  . oseltamivir (TAMIFLU) 75 MG capsule Take 1 capsule (75 mg total) by mouth every 12 (twelve) hours. (Patient not taking: Reported on 11/20/2015) 10 capsule 0 Not Taking at Unknown time    Inpatient Medications:  . allopurinol  300 mg Oral Daily  . aspirin  325 mg Oral QHS  . cholecalciferol  400 Units Oral Daily  . heparin   5,000 Units Subcutaneous 3 times per day  . insulin aspart  0-9 Units Subcutaneous TID WC  . loperamide  2 mg Oral BID  . magnesium oxide  800 mg Oral BID  . metoprolol tartrate  75 mg Oral BID  . pantoprazole  40 mg Oral Daily  . polycarbophil  1,250 mg Oral Daily  . regadenoson      . simvastatin  20 mg Oral QHS  . sodium chloride flush  3 mL Intravenous Q12H    Allergies: No Known Allergies  Social History   Social History  . Marital Status: Divorced    Spouse Name: N/A  . Number of Children: N/A  . Years of Education: N/A   Occupational History  . Not on file.   Social History Main Topics  . Smoking status: Never Smoker   . Smokeless tobacco: Not on file  . Alcohol Use: Yes  . Drug Use: No  . Sexual Activity: Not on file   Other Topics Concern  . Not on file   Social History Narrative     History reviewed: Brother HTN and DM and CAD, Mom DM  Review of Systems: All other systems reviewed and are otherwise negative except as noted above.  Physical Exam: Filed Vitals:   11/21/15 0949 11/21/15 0952 11/21/15 0953 11/21/15 1105  BP: 145/68 141/59 145/58 133/68  Pulse:    84  Temp:    98.6 F (37 C)  TempSrc:    Oral  Resp:    18  Height:      Weight:      SpO2:    95%    GEN- The patient is well appearing, alert and oriented x 3 today.   HEENT: normocephalic, atraumatic; sclera clear, conjunctiva pink; hearing intact; oropharynx clear; neck supple, no JVP Lymph- no cervical lymphadenopathy Lungs- Clear to ausculation bilaterally, normal work of breathing.  No wheezes, rales, rhonchi Heart- Regular rate and rhythm, no murmurs, rubs or gallops, PMI not laterally displaced GI- soft, non-tender, non-distended, bowel sounds present Extremities- no clubbing, cyanosis, or edema; DP/PT/radial pulses 2+ bilaterally MS- no significant deformity or atrophy Skin- warm and dry, no rash or lesion Psych- euthymic mood, full affect Neuro- no gross deficits  observed  Labs:   Lab Results  Component Value Date   WBC 5.2 11/20/2015   HGB 12.1* 11/20/2015   HCT 36.2* 11/20/2015   MCV 96.0 11/20/2015   PLT 125* 11/20/2015    Recent Labs Lab 11/20/15 1501  NA 136  K 4.2  CL 104  CO2 23  BUN 20  CREATININE 1.31*  CALCIUM  8.9  PROT 6.6  BILITOT 0.5  ALKPHOS 64  ALT 36  AST 35  GLUCOSE 266*      Radiology/Studies:  Dg Chest 2 View 11/20/2015  CLINICAL DATA:  Central chest tightness for 2-3 weeks, nonradiating, increased weakness, hypertension, diabetes mellitus, former smoker, post CABG, recent flu about the time his symptoms started EXAM: CHEST  2 VIEW COMPARISON:  10/03/2015 FINDINGS: Enlargement of cardiac silhouette post CABG. Mediastinal contours and pulmonary vascularity normal. Lungs clear. No pleural effusion or pneumothorax. Bones unremarkable. IMPRESSION: Enlargement of cardiac silhouette post CABG. No acute abnormalities. Electronically Signed   By: Lavonia Dana M.D.   On: 11/20/2015 15:30    EKG:  SR/low atrial rhythm,  bordeline 1st degree AVblock #2 is SR with PAT TELEMETRY: SR, multiple episodes PAT  Stress myoview was done today pending result   Assessment and Plan:   1. PAT     + palpitations, his lopressor has been up-titrated and the patient states since then has not felt any further     Agree with up-titration of his lopressor     Check echo     BP stable  2. Palpitations/CP, CAD     myoview pending, neg Trop     Management as per primary cardiology  Dr. Lovena Le to see  Signed, Tommye Standard, PA-C 11/21/2015 4:25 PM  Electrophysiology attending  Patient seen and examined. I've reviewed the findings as noted above and concur. The patient is a 75 year old man who was admitted to the hospital with palpitations and minimal other symptoms. He was found to have paroxysms of SVT. It's unclear to me whether or not the mechanism is atrial tachycardia with first-degree AV block or AV node reentrant tachycardia.  With up titration of his medical therapy his symptoms have improved. Cardiovascular exam reveals regular rate and rhythm. His lungs are clear bilaterally to auscultation. He has only trace peripheral edema in the extremities. EKG demonstrates sinus rhythm with no ventricular preexcitation. Telemetry demonstrates episodes of SVT at 160 bpm.  Assessment and plan 1. Recurrent tachycardia palpitations due to SVT - we discussed the treatment options with the patient. He is only had palpitations for less than a month. He has improved with up titration of his beta blocker therapy. I would suggest continuing beta blocker therapy for now. If he has recurrent symptoms, EP study and catheter ablation would be a consideration. 2. Hypertension - he will continue his current medical therapy. Hopefully up titration of his beta blocker will be well tolerated. 3. Coronary artery disease - he denies anginal symptoms Although he does have some chest fullness when he goes into SVT. 4. Disposition - the patient appears to be stable for discharge.  Cristopher Peru, M.D.

## 2015-11-21 NOTE — Progress Notes (Signed)
[facility] PROGRESS NOTE  Dwayne Brown [id number] DOB: Feb 05, 1941 DOA: 11/20/2015 PCP: No primary care provider on file.  Assessment/Plan: Dwayne Brown is a 75 y.o. male with PMH of hypertension, hyperlipidemia, non-insulin-dependent type 2 diabetes mellitus, chronic kidney disease stage III, and CAD status post 5 vessel CABG in 2002 who presents to the ED with intermittent, nonradiating chest pain of 3 weeks duration. While his URI symptoms and malaise have long since resolved, he continues to have a vague and intermittent "tightness" across the anterior chest  1. Chest pain  -troponin negative times 3.  - EKG with incomplete RBBB and p-wave abnormality that is not significantly changed relative to priors  - ASA 325 mg taken at home pta  - Lopressor given PO  - Zocor 40 mg given  -cardiology consulted. Recommending EP evaluation. Patient with multiples episodes of SVT on telemetry. Chest pain might be related to SVT. Metoprolol will be increased.   2. CAD  - 5v CABG in 2002 by Dr. Prescott Gum  - Follows with cardiology through the [facility]  - Continue ASA, statin, beta-blocker  -  lipids and A1c   3. CKD stage III - SCr 1.31 on admission, slightly up from his apparent baseline of 1.2 - Appears to be euvolemic on arrival  - Trend   4. Hypertension  - Continue Lopressor BID   5. Hyperlipidemia  - No lipid panel on file, will check fasting lipids in am  - Continue current dose Zocor   6. Type II DM  - Using glipizide and saxagliptin at home  - No A1c in EMR, will order  - Hold home agents while admitted  - SSI.   7. Normocytic anemia, mild thrombocytopenia  - Hgb is 12.1 with normal MCV and stable relative to priors  - Platelet count is 125,000, consistent with priors  - No sign of active blood loss   Code Status: Full code.  Family Communication; discussed with patient  Disposition Plan: Remain  inpatinet   Consultants:  Cardiology   Procedures:  NM scan  Antibiotics:  none  HPI/Subjective: Still with chest pain on and off  Objective: Filed Vitals:   11/21/15 0953 11/21/15 1105  BP: 145/58 133/68  Pulse:    Temp:  98.6 F (37 C)  Resp:  18    Intake/Output Summary (Last 24 hours) at 11/21/15 1124 Last data filed at 11/21/15 0758  Gross per 24 hour  Intake    480 ml  Output    500 ml  Net    -20 ml   Filed Weights   11/20/15 1435 11/20/15 1828 11/21/15 0620  Weight: 111.131 kg (245 lb) 109 kg (240 lb 4.8 oz) 108.546 kg (239 lb 4.8 oz)    Exam:   General:  Alert in no acte distress  Cardiovascular: S 1, S 2 RRR  Respiratory: CTA  Abdomen: BS present, soft, nt no edema  Musculoskeletal: no edema   Data Reviewed: Basic Metabolic Panel:  Recent Labs Lab 11/20/15 1501  NA 136  K 4.2  CL 104  CO2 23  GLUCOSE 266*  BUN 20  CREATININE 1.31*  CALCIUM 8.9   Liver Function Tests:  Recent Labs Lab 11/20/15 1501  AST 35  ALT 36  ALKPHOS 64  BILITOT 0.5  PROT 6.6  ALBUMIN 3.8   No results for input(s): LIPASE, AMYLASE in the last 168 hours. No results for input(s): AMMONIA in the last 168 hours. CBC:  Recent Labs Lab 11/20/15  1501  WBC 5.2  NEUTROABS 3.9  HGB 12.1*  HCT 36.2*  MCV 96.0  PLT 125*   Cardiac Enzymes:  Recent Labs Lab 11/20/15 1501 11/20/15 1725 11/20/15 2145 11/21/15 0255  TROPONINI <0.03 <0.03 <0.03 <0.03   BNP (last 3 results)  Recent Labs  11/20/15 2145  BNP 53.7    ProBNP (last 3 results) No results for input(s): PROBNP in the last 8760 hours.  CBG:  Recent Labs Lab 11/20/15 1440  GLUCAP 270*    No results found for this or any previous visit (from the past 240 hour(s)).   Studies: Dg Chest 2 View  11/20/2015  CLINICAL DATA:  Central chest tightness for 2-3 weeks, nonradiating, increased weakness, hypertension, diabetes mellitus, former smoker, post CABG, recent flu about the time  his symptoms started EXAM: CHEST  2 VIEW COMPARISON:  10/03/2015 FINDINGS: Enlargement of cardiac silhouette post CABG. Mediastinal contours and pulmonary vascularity normal. Lungs clear. No pleural effusion or pneumothorax. Bones unremarkable. IMPRESSION: Enlargement of cardiac silhouette post CABG. No acute abnormalities. Electronically Signed   By: Lavonia Dana M.D.   On: 11/20/2015 15:30    Scheduled Meds: . allopurinol  300 mg Oral Daily  . aspirin  325 mg Oral QHS  . cholecalciferol  400 Units Oral Daily  . heparin  5,000 Units Subcutaneous 3 times per day  . loperamide  2 mg Oral BID  . magnesium oxide  800 mg Oral BID  . metoprolol tartrate  50 mg Oral BID  . pantoprazole  40 mg Oral Daily  . polycarbophil  1,250 mg Oral Daily  . regadenoson      . simvastatin  20 mg Oral QHS  . sodium chloride flush  3 mL Intravenous Q12H   Continuous Infusions:   Principal Problem:   Chest pain Active Problems:   Diabetes mellitus without complication (HCC)   High cholesterol   Essential hypertension   CAD (coronary artery disease) of bypass graft   Chronic kidney disease (CKD), stage III (moderate)   Normocytic anemia   Thrombocytopenia (HCC)    Time spent: 25 minutes.     Niel Hummer A  [facility] Pager [phone]. If 7PM-7AM, please contact night-coverage at [email or web address], password [facility] 11/21/2015, 11:24 AM

## 2015-11-21 NOTE — Progress Notes (Signed)
lexiscan myoview completed without complications.  Nuc results to follow.  + runs of SVT  Which are also present on tele.  Admit EKG did appear to be PAF.

## 2015-11-22 ENCOUNTER — Observation Stay (HOSPITAL_COMMUNITY): Payer: Medicare Other

## 2015-11-22 DIAGNOSIS — R0789 Other chest pain: Secondary | ICD-10-CM | POA: Diagnosis not present

## 2015-11-22 DIAGNOSIS — I471 Supraventricular tachycardia: Secondary | ICD-10-CM | POA: Diagnosis not present

## 2015-11-22 DIAGNOSIS — I25709 Atherosclerosis of coronary artery bypass graft(s), unspecified, with unspecified angina pectoris: Secondary | ICD-10-CM | POA: Diagnosis not present

## 2015-11-22 DIAGNOSIS — N183 Chronic kidney disease, stage 3 (moderate): Secondary | ICD-10-CM | POA: Diagnosis not present

## 2015-11-22 LAB — BASIC METABOLIC PANEL
Anion gap: 12 (ref 5–15)
BUN: 16 mg/dL (ref 6–20)
CALCIUM: 8.8 mg/dL — AB (ref 8.9–10.3)
CO2: 23 mmol/L (ref 22–32)
CREATININE: 1.38 mg/dL — AB (ref 0.61–1.24)
Chloride: 102 mmol/L (ref 101–111)
GFR calc non Af Amer: 49 mL/min — ABNORMAL LOW (ref >60)
GFR, EST AFRICAN AMERICAN: 57 mL/min — AB (ref >60)
Glucose, Bld: 178 mg/dL — ABNORMAL HIGH (ref 65–99)
Potassium: 4.3 mmol/L (ref 3.5–5.1)
SODIUM: 137 mmol/L (ref 135–145)

## 2015-11-22 LAB — ECHOCARDIOGRAM COMPLETE
Height: 70 in
Weight: 3825.6 oz

## 2015-11-22 LAB — GLUCOSE, CAPILLARY: Glucose-Capillary: 162 mg/dL — ABNORMAL HIGH (ref 65–99)

## 2015-11-22 MED ORDER — METOPROLOL TARTRATE 75 MG PO TABS: 75.0000 mg | ORAL_TABLET | Freq: Two times a day (BID) | ORAL | Status: DC

## 2015-11-22 NOTE — Discharge Summary (Signed)
Physician Discharge Summary  Dwayne Brown [id number] DOB: [birth year] DOA: 11/20/2015  PCP: No primary care provider on file.  Admit date: 11/20/2015 Discharge date: 11/22/2015  Time spent: 35 minutes  Recommendations for Outpatient Follow-up:  Follow up with primary cardiology  FU echo results pending at time of discharge   Discharge Diagnoses:    Chest pain secondary to SVT   Diabetes mellitus without complication ([place])   High cholesterol   Essential hypertension   CAD (coronary artery disease) of bypass graft   Chronic kidney disease (CKD), stage III (moderate)   Normocytic anemia   Thrombocytopenia ([place])   Discharge Condition: stable  Diet recommendation: Heart Healthy   Filed Weights   11/20/15 1828 11/21/15 0620 11/22/15 0528  Weight: 109 kg (240 lb 4.8 oz) 108.546 kg (239 lb 4.8 oz) 108.455 kg (239 lb 1.6 oz)    History of present illness:  Dwayne Brown is a 75 y.o. male with PMH of hypertension, hyperlipidemia, non-insulin-dependent type 2 diabetes mellitus, chronic kidney disease stage III, and CAD status post 5 vessel CABG in 2002 who presents to the ED with intermittent, nonradiating chest pain of 3 weeks duration. Patient reports that he has not been in his usual state of health since having a flulike illness in February of this year. While his URI symptoms and malaise have long since resolved, he continues to have a vague and intermittent "tightness" across the anterior chest. He takes a daily aspirin 325 mg, and while he has a prescription for nitroglycerin, has never taken it. He had been following with cardiology at the [facility], but it is been approximately 2 years since last visit. He continues to follow with his primary care physician and has been maintained on Lopressor and Zocor in addition to the daily aspirin. Chest pain is difficult for the patient to describe, mild-moderate in intensity, "tightness" in character, with no appreciable alleviating or exacerbating  factors identified. There is no associated dyspnea, diaphoresis, or nausea. Of note, patient has been under considerable psychosocial stress since February of this year when his mother died. Patient had an episode prior to his arrival which was worse than priors, prompting his presentation to the [place] emergency department.  In the [facility] ED, patient was found to be afebrile, saturating well on room air, and with vital signs stable. He had taken a 325 mg aspirin earlier in the day. 1 inch of Nitropaste was placed with no appreciable change in the patient's symptoms. Eventually, we'll still in the ED, the pain subsided. He was given a dose of Lopressor by mouth and 40 mg Zocor. Transfer to [facility] for ongoing observation was requested and accepted.  The patient is interviewed and examined on the telemetry unit at [facility] where he continues to be free of significant chest pain, is afebrile, saturating well on room air, and with vital signs stable. He will be observed overnight with serial measurement of cardiac biomarkers and repeat EKG. Cardiology has been asked to evaluate the patient in the morning.  Hospital Course:  Dwayne Brown is a 75 y.o. male with PMH of hypertension, hyperlipidemia, non-insulin-dependent type 2 diabetes mellitus, chronic kidney disease stage III, and CAD status post 5 vessel CABG in 2002 who presents to the ED with intermittent, nonradiating chest pain of 3 weeks duration. While his URI symptoms and malaise have long since resolved, he continues to have a vague and intermittent "tightness" across the anterior chest  1. Chest pain  -  troponin negative times 3.  - EKG with incomplete RBBB and p-wave abnormality that is not significantly changed relative to priors  - ASA 325 mg taken at home pta  - Lopressor given PO  - Zocor 40 mg given  -cardiology consulted. Recommending EP evaluation. Patient with multiples episodes of SVT on telemetry. Chest  pain might be related to SVT.  symptoms resolved with increase dose metoprolol//  Follow up with primary cardiologist for further care   2. CAD  - 5v CABG in 2002 by Dr. Prescott Gum  - Follows with cardiology through the [facility]  - Continue ASA, statin, beta-blocker  - lipids and A1c   3. CKD stage III - SCr 1.31 on admission, slightly up from his apparent baseline of 1.2 - Appears to be euvolemic on arrival  - Trend   4. Hypertension  - Continue Lopressor BID   5. Hyperlipidemia  - No lipid panel on file, will check fasting lipids in am  - Continue current dose Zocor   6. Type II DM  - Using glipizide and saxagliptin at home  - No A1c in EMR, will order  - resume home agents at discharge  - SSI.   7. Normocytic anemia, mild thrombocytopenia  - Hgb is 12.1 with normal MCV and stable relative to priors  - Platelet count is 125,000, consistent with priors  - No sign of active blood loss  Procedures:  Stress test   Consultations:  Cardiology   Discharge Exam: Filed Vitals:   11/21/15 2100 11/22/15 0528  BP: 119/52 111/48  Pulse: 76 67  Temp: 98.4 F (36.9 C) 97.8 F (36.6 C)  Resp: 20 19    General: NAD Cardiovascular: S 1, S 2 RRR Respiratory: CTA  Discharge Instructions   Discharge Instructions    Diet - low sodium heart healthy    Complete by:  As directed      Increase activity slowly    Complete by:  As directed           Current Discharge Medication List    CONTINUE these medications which have CHANGED   Details  Metoprolol Tartrate 75 MG TABS Take 75 mg by mouth 2 (two) times daily. Qty: 60 tablet, Refills: 0      CONTINUE these medications which have NOT CHANGED   Details  allopurinol (ZYLOPRIM) 300 MG tablet Take 300 mg by mouth daily.    aspirin 325 MG tablet Take 325 mg by mouth at bedtime.     CALCIUM POLYCARBOPHIL PO Take 1,250 mg by mouth daily. 625 mg per tablet    Cholecalciferol (VITAMIN D PO) Take 1  tablet by mouth at bedtime.    glipiZIDE (GLUCOTROL) 10 MG tablet Take 10 mg by mouth 2 (two) times daily before a meal.    loperamide (IMODIUM) 2 MG capsule Take 2 mg by mouth 2 (two) times daily.    Magnesium Oxide 420 MG TABS Take 840 mg by mouth 2 (two) times daily.    omeprazole (PRILOSEC) 20 MG capsule Take 20 mg by mouth daily.    Polyvinyl Alcohol-Povidone (REFRESH OP) Place 1 drop into both eyes daily as needed (dry eyes).    saxagliptin HCl (ONGLYZA) 5 MG TABS tablet Take 5 mg by mouth at bedtime.    simvastatin (ZOCOR) 20 MG tablet Take 20 mg by mouth at bedtime.       STOP taking these medications     ibuprofen (ADVIL,MOTRIN) 200 MG tablet  oseltamivir (TAMIFLU) 75 MG capsule        No Known Allergies Follow-up Information    Please follow up.   Why:  follow up with your primary cardiologist        The results of significant diagnostics from this hospitalization (including imaging, microbiology, ancillary and laboratory) are listed below for reference.    Significant Diagnostic Studies: Dg Chest 2 View  11/20/2015  CLINICAL DATA:  Central chest tightness for 2-3 weeks, nonradiating, increased weakness, hypertension, diabetes mellitus, former smoker, post CABG, recent flu about the time his symptoms started EXAM: CHEST  2 VIEW COMPARISON:  10/03/2015 FINDINGS: Enlargement of cardiac silhouette post CABG. Mediastinal contours and pulmonary vascularity normal. Lungs clear. No pleural effusion or pneumothorax. Bones unremarkable. IMPRESSION: Enlargement of cardiac silhouette post CABG. No acute abnormalities. Electronically Signed   By: Lavonia Dana M.D.   On: 11/20/2015 15:30   Nm Myocar Single W/spect W/wall Motion And Ef  11/21/2015   There was no ST segment deviation noted during stress.  The study is normal.  This is a low risk study.  Nuclear stress EF: 66%.  Low risk stress nuclear study with normal perfusion and normal left ventricular regional and global  systolic function.    Microbiology: No results found for this or any previous visit (from the past 240 hour(s)).   Labs: Basic Metabolic Panel:  Recent Labs Lab 11/20/15 1501 11/21/15 2354  NA 136 137  K 4.2 4.3  CL 104 102  CO2 23 23  GLUCOSE 266* 178*  BUN 20 16  CREATININE 1.31* 1.38*  CALCIUM 8.9 8.8*   Liver Function Tests:  Recent Labs Lab 11/20/15 1501  AST 35  ALT 36  ALKPHOS 64  BILITOT 0.5  PROT 6.6  ALBUMIN 3.8   No results for input(s): LIPASE, AMYLASE in the last 168 hours. No results for input(s): AMMONIA in the last 168 hours. CBC:  Recent Labs Lab 11/20/15 1501  WBC 5.2  NEUTROABS 3.9  HGB 12.1*  HCT 36.2*  MCV 96.0  PLT 125*   Cardiac Enzymes:  Recent Labs Lab 11/20/15 1501 11/20/15 1725 11/20/15 2145 11/21/15 0255  TROPONINI <0.03 <0.03 <0.03 <0.03   BNP: BNP (last 3 results)  Recent Labs  11/20/15 2145  BNP 53.7    ProBNP (last 3 results) No results for input(s): PROBNP in the last 8760 hours.  CBG:  Recent Labs Lab 11/20/15 1440 11/21/15 1155 11/21/15 1654 11/21/15 2113 11/22/15 0610  GLUCAP 270* 296* 211* 226* 162*       Signed:  Niel Hummer A MD.  [facility] 11/22/2015, 11:41 AM

## 2015-11-22 NOTE — Progress Notes (Addendum)
Patient Name: Dwayne Brown Date of Encounter: 11/22/2015   SUBJECTIVE   Felling lot better on increased dose of BB. No further chest pain or sob.   CURRENT MEDS . allopurinol  300 mg Oral Daily  . aspirin  325 mg Oral QHS  . cholecalciferol  400 Units Oral Daily  . heparin  5,000 Units Subcutaneous 3 times per day  . insulin aspart  0-9 Units Subcutaneous TID WC  . loperamide  2 mg Oral BID  . magnesium oxide  800 mg Oral BID  . metoprolol tartrate  75 mg Oral BID  . pantoprazole  40 mg Oral Daily  . polycarbophil  1,250 mg Oral Daily  . simvastatin  20 mg Oral QHS  . sodium chloride flush  3 mL Intravenous Q12H    OBJECTIVE  Filed Vitals:   11/21/15 0953 11/21/15 1105 11/21/15 2100 11/22/15 0528  BP: 145/58 133/68 119/52 111/48  Pulse:  84 76 67  Temp:  98.6 F (37 C) 98.4 F (36.9 C) 97.8 F (36.6 C)  TempSrc:  Oral Oral Oral  Resp:  18 20 19   Height:      Weight:    239 lb 1.6 oz (108.455 kg)  SpO2:  95% 94% 97%    Intake/Output Summary (Last 24 hours) at 11/22/15 1000 Last data filed at 11/22/15 0923  Gross per 24 hour  Intake    480 ml  Output    900 ml  Net   -420 ml   Filed Weights   11/20/15 1828 11/21/15 0620 11/22/15 0528  Weight: 240 lb 4.8 oz (109 kg) 239 lb 4.8 oz (108.546 kg) 239 lb 1.6 oz (108.455 kg)    PHYSICAL EXAM  General: Pleasant, NAD. Neuro: Alert and oriented X 3. Moves all extremities spontaneously. Psych: Normal affect. HEENT:  Normal  Neck: Supple without bruits or JVD. Lungs:  Resp regular and unlabored, CTA. Heart: RRR no s3, s4, or murmurs. Abdomen: Soft, non-tender, non-distended, BS + x 4.  Extremities: No clubbing, cyanosis or edema. DP/PT/Radials 2+ and equal bilaterally.  Accessory Clinical Findings  CBC  Recent Labs  11/20/15 1501  WBC 5.2  NEUTROABS 3.9  HGB 12.1*  HCT 36.2*  MCV 96.0  PLT 0000000*   Basic Metabolic Panel  Recent Labs  11/20/15 1501 11/21/15 2354  NA 136 137  K 4.2 4.3  CL  104 102  CO2 23 23  GLUCOSE 266* 178*  BUN 20 16  CREATININE 1.31* 1.38*  CALCIUM 8.9 8.8*   Liver Function Tests  Recent Labs  11/20/15 1501  AST 35  ALT 36  ALKPHOS 64  BILITOT 0.5  PROT 6.6  ALBUMIN 3.8   No results for input(s): LIPASE, AMYLASE in the last 72 hours. Cardiac Enzymes  Recent Labs  11/20/15 1725 11/20/15 2145 11/21/15 0255  TROPONINI <0.03 <0.03 <0.03   BNP Invalid input(s): POCBNP D-Dimer No results for input(s): DDIMER in the last 72 hours. Hemoglobin A1C No results for input(s): HGBA1C in the last 72 hours. Fasting Lipid Panel No results for input(s): CHOL, HDL, LDLCALC, TRIG, CHOLHDL, LDLDIRECT in the last 72 hours. Thyroid Function Tests No results for input(s): TSH, T4TOTAL, T3FREE, THYROIDAB in the last 72 hours.  Invalid input(s): FREET3  TELE  Sinus rhythm at rate mostly in 70s. One episode of 2nd degree type I HB this morning.   Radiology/Studies  Dg Chest 2 View  11/20/2015  CLINICAL DATA:  Central chest tightness for 2-3 weeks, nonradiating, increased  weakness, hypertension, diabetes mellitus, former smoker, post CABG, recent flu about the time his symptoms started EXAM: CHEST  2 VIEW COMPARISON:  10/03/2015 FINDINGS: Enlargement of cardiac silhouette post CABG. Mediastinal contours and pulmonary vascularity normal. Lungs clear. No pleural effusion or pneumothorax. Bones unremarkable. IMPRESSION: Enlargement of cardiac silhouette post CABG. No acute abnormalities. Electronically Signed   By: Lavonia Dana M.D.   On: 11/20/2015 15:30   Nm Myocar Single W/spect W/wall Motion And Ef  11/21/2015   There was no ST segment deviation noted during stress.  The study is normal.  This is a low risk study.  Nuclear stress EF: 66%.  Low risk stress nuclear study with normal perfusion and normal left ventricular regional and global systolic function.    ASSESSMENT AND PLAN   1. Recurrent tachycardia palpitations due to SVT  - No further  episode on increased dose of metoprolol. He is feeling much better. One episode of 2nd degree type I HB this morning. asymptomatic.  If he has recurrent symptoms, EP study and catheter ablation would be a consideration.  2. Hypertension -Stable  3. Coronary artery disease - No anginal symptoms, although he does have some chest fullness when he goes into SVT. Myoview was low risk with EF of 66%. The patient has ruled out.   4. Disposition - Ok to discharge.  f/u with cardiologist @ [place] within 2-4 weeks.   Signed, Bhagat,Bhavinkumar PA-C Pager [phone]  I have examined the patient and reviewed assessment and plan and discussed with patient.  Agree with above as stated.  Echo pending.  EF was normal by nuclear stress test.  Sx controlled on higher dose of metoprolol.  No sx of bradycardia noted.  Carlosdaniel Grob S.

## 2015-11-22 NOTE — Progress Notes (Signed)
Pt has orders to be discharged. Discharge instructions given and pt has no additional questions at this time. Medication regimen reviewed and pt educated. Pt verbalized understanding and has no additional questions. Telemetry box removed. IV removed and site in good condition. Pt stable and waiting for transportation. 

## 2019-03-19 ENCOUNTER — Other Ambulatory Visit: Payer: Self-pay

## 2019-10-03 ENCOUNTER — Emergency Department (HOSPITAL_BASED_OUTPATIENT_CLINIC_OR_DEPARTMENT_OTHER)
Admission: EM | Admit: 2019-10-03 | Discharge: 2019-10-03 | Disposition: A | Discharge status: Home / Self Care | Payer: No Typology Code available for payment source | Attending: Emergency Medicine | Admitting: Emergency Medicine

## 2019-10-03 ENCOUNTER — Encounter (HOSPITAL_BASED_OUTPATIENT_CLINIC_OR_DEPARTMENT_OTHER): Payer: Self-pay | Admitting: *Deleted

## 2019-10-03 ENCOUNTER — Other Ambulatory Visit: Payer: Self-pay

## 2019-10-03 ENCOUNTER — Emergency Department (HOSPITAL_BASED_OUTPATIENT_CLINIC_OR_DEPARTMENT_OTHER): Payer: No Typology Code available for payment source

## 2019-10-03 DIAGNOSIS — I25810 Atherosclerosis of coronary artery bypass graft(s) without angina pectoris: Secondary | ICD-10-CM | POA: Diagnosis not present

## 2019-10-03 DIAGNOSIS — M545 Low back pain: Secondary | ICD-10-CM | POA: Diagnosis not present

## 2019-10-03 DIAGNOSIS — N183 Chronic kidney disease, stage 3 unspecified: Secondary | ICD-10-CM | POA: Diagnosis not present

## 2019-10-03 DIAGNOSIS — Z7984 Long term (current) use of oral hypoglycemic drugs: Secondary | ICD-10-CM | POA: Insufficient documentation

## 2019-10-03 DIAGNOSIS — Z79899 Other long term (current) drug therapy: Secondary | ICD-10-CM | POA: Diagnosis not present

## 2019-10-03 DIAGNOSIS — E1122 Type 2 diabetes mellitus with diabetic chronic kidney disease: Secondary | ICD-10-CM | POA: Insufficient documentation

## 2019-10-03 DIAGNOSIS — M5489 Other dorsalgia: Secondary | ICD-10-CM | POA: Diagnosis present

## 2019-10-03 DIAGNOSIS — Z7982 Long term (current) use of aspirin: Secondary | ICD-10-CM | POA: Diagnosis not present

## 2019-10-03 DIAGNOSIS — I129 Hypertensive chronic kidney disease with stage 1 through stage 4 chronic kidney disease, or unspecified chronic kidney disease: Secondary | ICD-10-CM | POA: Insufficient documentation

## 2019-10-03 DIAGNOSIS — M5432 Sciatica, left side: Secondary | ICD-10-CM | POA: Diagnosis not present

## 2019-10-03 DIAGNOSIS — M5442 Lumbago with sciatica, left side: Secondary | ICD-10-CM | POA: Diagnosis not present

## 2019-10-03 LAB — URINALYSIS, ROUTINE W REFLEX MICROSCOPIC
Bilirubin Urine: NEGATIVE
Glucose, UA: NEGATIVE mg/dL
Hgb urine dipstick: NEGATIVE
Ketones, ur: NEGATIVE mg/dL
Leukocytes,Ua: NEGATIVE
Nitrite: NEGATIVE
Protein, ur: NEGATIVE mg/dL
Specific Gravity, Urine: 1.025 (ref 1.005–1.030)
pH: 6 (ref 5.0–8.0)

## 2019-10-03 MED ORDER — DEXAMETHASONE SODIUM PHOSPHATE 10 MG/ML IJ SOLN
10.0000 mg | Freq: Once | INTRAMUSCULAR | Status: AC
  Administered 2019-10-03: 10 mg via INTRAVENOUS
  Filled 2019-10-03: qty 1

## 2019-10-03 MED ORDER — GABAPENTIN 300 MG PO CAPS: 300.0000 mg | ORAL_CAPSULE | Freq: Three times a day (TID) | ORAL | 0 refills | Status: DC

## 2019-10-03 MED ORDER — MELOXICAM 7.5 MG PO TABS: 7.5000 mg | ORAL_TABLET | Freq: Every day | ORAL | 0 refills | Status: DC

## 2019-10-03 MED ORDER — HYDROCODONE-ACETAMINOPHEN 5-325 MG PO TABS: 1.0000 | ORAL_TABLET | Freq: Three times a day (TID) | ORAL | 0 refills | Status: DC | PRN

## 2019-10-03 MED ORDER — LORAZEPAM 2 MG/ML IJ SOLN
0.5000 mg | Freq: Once | INTRAMUSCULAR | Status: AC
  Administered 2019-10-03: 0.5 mg via INTRAVENOUS
  Filled 2019-10-03: qty 1

## 2019-10-03 MED ORDER — GABAPENTIN 300 MG PO CAPS
300.0000 mg | ORAL_CAPSULE | Freq: Once | ORAL | Status: AC
  Administered 2019-10-03: 14:00:00 300 mg via ORAL
  Filled 2019-10-03: qty 1

## 2019-10-03 MED ORDER — DEXAMETHASONE 4 MG PO TABS: 4.0000 mg | ORAL_TABLET | Freq: Two times a day (BID) | ORAL | 0 refills | Status: DC

## 2019-10-03 MED ORDER — HYDROCODONE-ACETAMINOPHEN 5-325 MG PO TABS: 1.0000 | ORAL_TABLET | Freq: Four times a day (QID) | ORAL | 0 refills | Status: DC | PRN

## 2019-10-03 MED ORDER — KETOROLAC TROMETHAMINE 15 MG/ML IJ SOLN
15.0000 mg | Freq: Once | INTRAMUSCULAR | Status: AC
  Administered 2019-10-03: 14:00:00 15 mg via INTRAVENOUS
  Filled 2019-10-03: qty 1

## 2019-10-03 MED ORDER — HYDROMORPHONE HCL 1 MG/ML IJ SOLN
0.7500 mg | Freq: Once | INTRAMUSCULAR | Status: AC
Start: 2019-10-03 — End: 2019-10-03
  Administered 2019-10-03: 0.75 mg via INTRAVENOUS
  Filled 2019-10-03: qty 1

## 2019-10-03 MED ORDER — LACTATED RINGERS IV BOLUS
1000.0000 mL | Freq: Once | INTRAVENOUS | Status: AC
  Administered 2019-10-03: 1000 mL via INTRAVENOUS

## 2019-10-03 MED ORDER — DEXAMETHASONE 4 MG PO TABS: 4.0000 mg | ORAL_TABLET | Freq: Two times a day (BID) | ORAL | 0 refills | Status: AC

## 2019-10-03 NOTE — ED Triage Notes (Signed)
Presents with left sided back/ flank pain, onset the past 3 weeks, Monday pain increased to a 10 on 0-10 scale

## 2019-10-03 NOTE — ED Provider Notes (Signed)
[facility] Provider Note   CSN: [id number] Arrival date & time: 10/03/19  1235     History Chief Complaint  Patient presents with  . Back Pain    Dwayne Brown is a 79 y.o. male.  HPI   79 year old male with left lower back/left buttock pain.  Gradual onset about 3 weeks ago.  Pain is been constant progressive.  It hurts at rest and significantly worse with movement even with standing.  Radiation down into his left leg.  No numbness or tingling.  No weakness.  No urinary complaints.  He states that he was seen at the [facility] for the same complaint and was prescribed some type of muscle relaxant cream.  He had been using this without any improvement.  Past Medical History:  Diagnosis Date  . CAD (coronary artery disease) of bypass graft   . Cancer ([place])   . Diabetes mellitus without complication ([place])   . Essential hypertension   . High cholesterol     Patient Active Problem List   Diagnosis Date Noted  . Chest pain 11/20/2015  . Chronic kidney disease (CKD), stage III (moderate) 11/20/2015  . Normocytic anemia 11/20/2015  . Thrombocytopenia ([place]) 11/20/2015  . Diabetes mellitus without complication ([place])   . High cholesterol   . Essential hypertension   . CAD (coronary artery disease) of bypass graft     Past Surgical History:  Procedure Laterality Date  . CHOLECYSTECTOMY    . 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 CORONARY ARTERY BYPASS GRAFT    . VASECTOMY         History reviewed. No pertinent family history.  Social History   Tobacco Use  . Smoking status: Never Smoker  Substance Use Topics  . Alcohol use: Yes  . Drug use: No    Home Medications Prior to Admission medications   Medication Sig Start Date End Date Taking? Authorizing Provider  allopurinol (ZYLOPRIM) 300 MG tablet Take 300 mg by mouth daily.   Yes [provider]  CALCIUM POLYCARBOPHIL PO Take 1,250 mg by  mouth daily. 625 mg per tablet   Yes [provider]  Cholecalciferol (VITAMIN D PO) Take 1 tablet by mouth at bedtime.   Yes [provider]  glipiZIDE (GLUCOTROL) 10 MG tablet Take 10 mg by mouth 2 (two) times daily before a meal.   Yes [provider]  Magnesium Oxide 420 MG TABS Take 840 mg by mouth 2 (two) times daily.   Yes [provider]  Metoprolol Tartrate 75 MG TABS Take 75 mg by mouth 2 (two) times daily. 11/22/15  Yes Regalado, Belkys A, MD  simvastatin (ZOCOR) 20 MG tablet Take 20 mg by mouth at bedtime.    Yes [provider]  aspirin 325 MG tablet Take 325 mg by mouth at bedtime.     [provider]  loperamide (IMODIUM) 2 MG capsule Take 2 mg by mouth 2 (two) times daily.    [provider]  omeprazole (PRILOSEC) 20 MG capsule Take 20 mg by mouth daily.    [provider]  Polyvinyl Alcohol-Povidone (REFRESH OP) Place 1 drop into both eyes daily as needed (dry eyes).    [provider]  saxagliptin HCl (ONGLYZA) 5 MG TABS tablet Take 5 mg by mouth at bedtime.    [provider]    Allergies    Patient has no known allergies.  Review of Systems   Review of Systems All systems reviewed and negative, other than as noted in HPI. Physical Exam Updated Vital Signs BP (!) 195/78 (BP Location: Right Arm)   Pulse 76   Temp 97.8 F (36.6 C) (Oral)   Resp (!) 24   Ht 5\' 10"  (1.778 m)   Wt 106.6 kg   SpO2 98%   BMI 33.72 kg/m   Physical Exam Vitals and nursing note reviewed.  Constitutional:      General: He is not in acute distress.    Appearance: He is well-developed.  HENT:     Head: Normocephalic and atraumatic.  Eyes:     General:        Right eye: No discharge.        Left eye: No discharge.     Conjunctiva/sclera: Conjunctivae normal.  Cardiovascular:     Rate and Rhythm: Normal rate and regular rhythm.     Heart sounds: Normal heart sounds. No murmur. No friction rub. No  gallop.   Pulmonary:     Effort: Pulmonary effort is normal. No respiratory distress.     Breath sounds: Normal breath sounds.  Abdominal:     General: There is no distension.     Palpations: Abdomen is soft.     Tenderness: There is no abdominal tenderness.  Musculoskeletal:     Cervical back: Neck supple.     Comments: Tenderness to palpation in the left lower lumbar region towards the SI joint.  No overlying skin changes.  Increased pain in lower back with range of motion of the right leg but he can actively range it.  Neurovascular intact.  Skin:    General: Skin is warm and dry.  Neurological:     Mental Status: He is alert.  Psychiatric:        Behavior: Behavior normal.        Thought Content: Thought content normal.     ED Results / Procedures / Treatments   Labs (all labs ordered are listed, but only abnormal results are displayed) Labs Reviewed  URINALYSIS, ROUTINE W REFLEX MICROSCOPIC    EKG None  Radiology No results found.  Procedures Procedures (including critical care time)  Medications Ordered in ED Medications  ketorolac (TORADOL) 15 MG/ML injection 15 mg (has no administration in time range)  HYDROmorphone (DILAUDID) injection 0.75 mg (has no administration in time range)  dexamethasone (DECADRON) injection 10 mg (has no administration in time range)  lactated ringers bolus 1,000 mL (has no administration in time range)  LORazepam (ATIVAN) injection 0.5 mg (has no administration in time range)  gabapentin (NEURONTIN) capsule 300 mg (has no administration in time range)    ED Course  I have reviewed the triage vital signs and the nursing notes.  Pertinent labs & imaging results that were available during my care of the patient were reviewed by me and considered in my medical decision making (see chart for details).    MDM Rules/Calculators/A&P                      79 year old male with symptoms consistent with sciatica.  Neuro exam nonfocal.   Feeling markedly better after meds here in the emergency room.  Plan continue symptomatic treatment.  Return precautions were discussed.  Outpatient follow-up otherwise. Final Clinical Impression(s) / ED Diagnoses Final diagnoses:  Sciatica of left side    Rx / DC Orders ED Discharge Orders    None  Virgel Manifold, MD 10/06/19 1240

## 2019-10-03 NOTE — ED Notes (Signed)
Patient transported to X-ray 

## 2019-10-12 DIAGNOSIS — Z87891 Personal history of nicotine dependence: Secondary | ICD-10-CM | POA: Diagnosis not present

## 2019-10-12 DIAGNOSIS — M5126 Other intervertebral disc displacement, lumbar region: Secondary | ICD-10-CM | POA: Diagnosis not present

## 2019-10-12 DIAGNOSIS — M5440 Lumbago with sciatica, unspecified side: Secondary | ICD-10-CM | POA: Diagnosis not present

## 2021-10-04 IMAGING — CR DG LUMBAR SPINE COMPLETE 4+V
4 series · 4 of 4 positions shown · non-contrast
Comparison: None.

CLINICAL DATA: Low back pain, no known injury, initial encounter

EXAM:
LUMBAR SPINE - COMPLETE 4+ VIEW

[t l-spine a.p.]
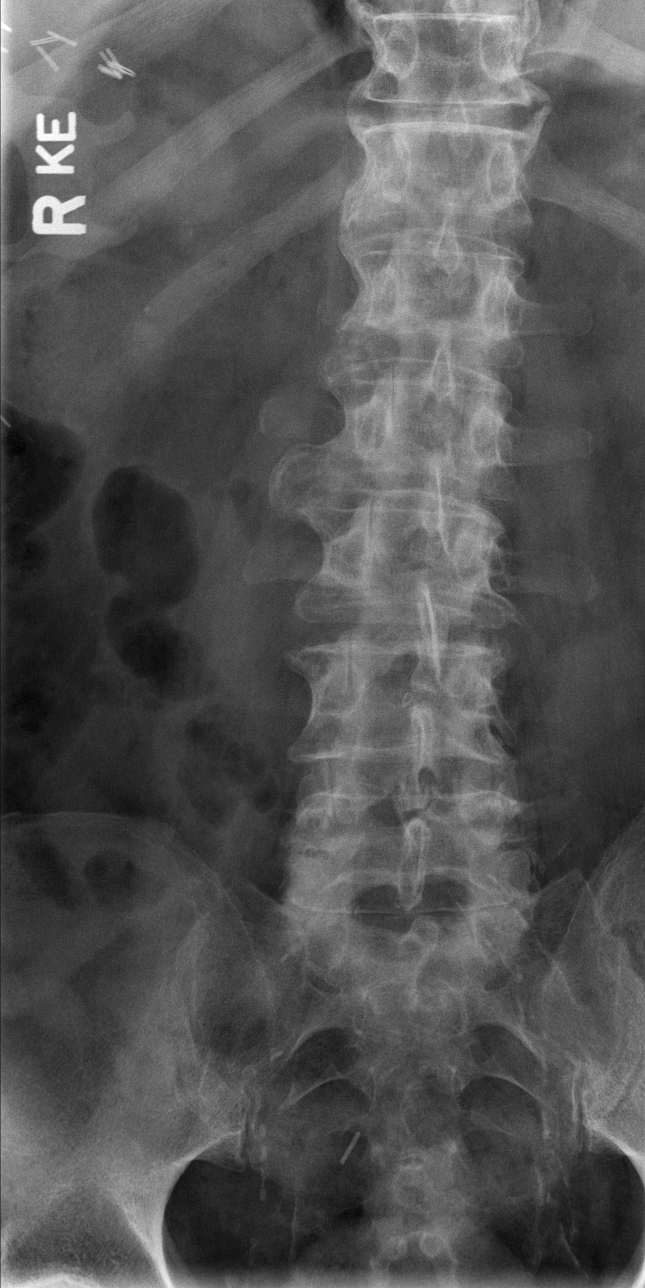

[t l-spine oblique exposure]
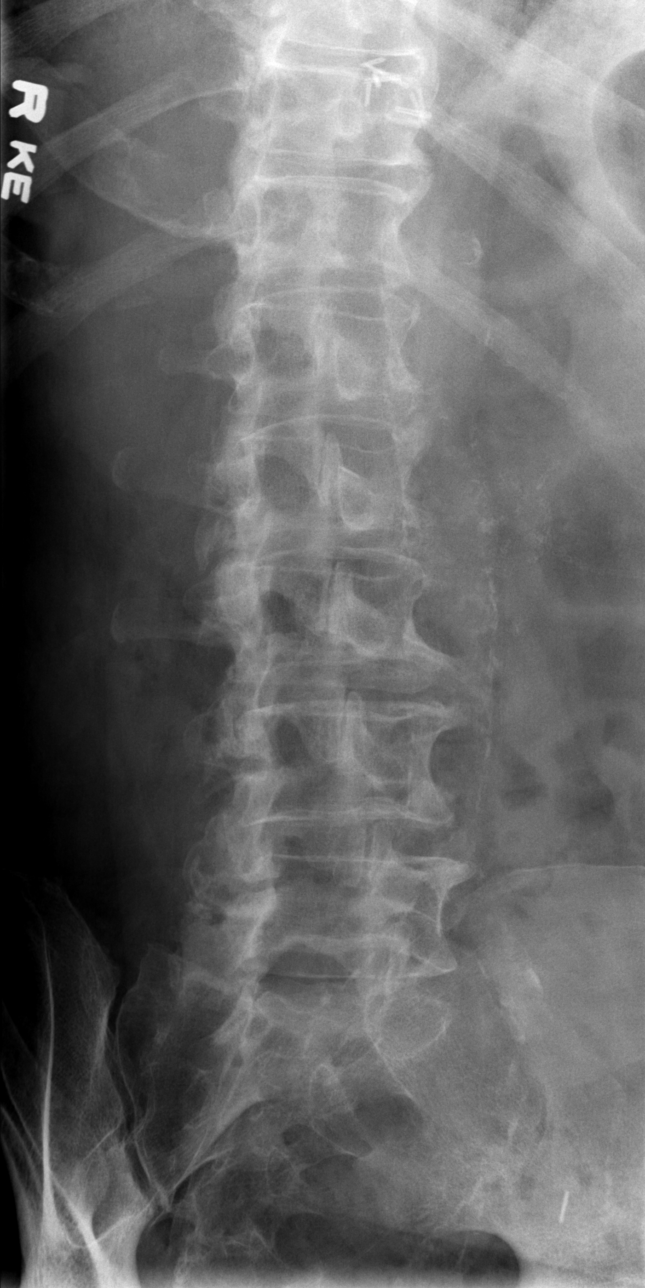

[t l-spine lat]
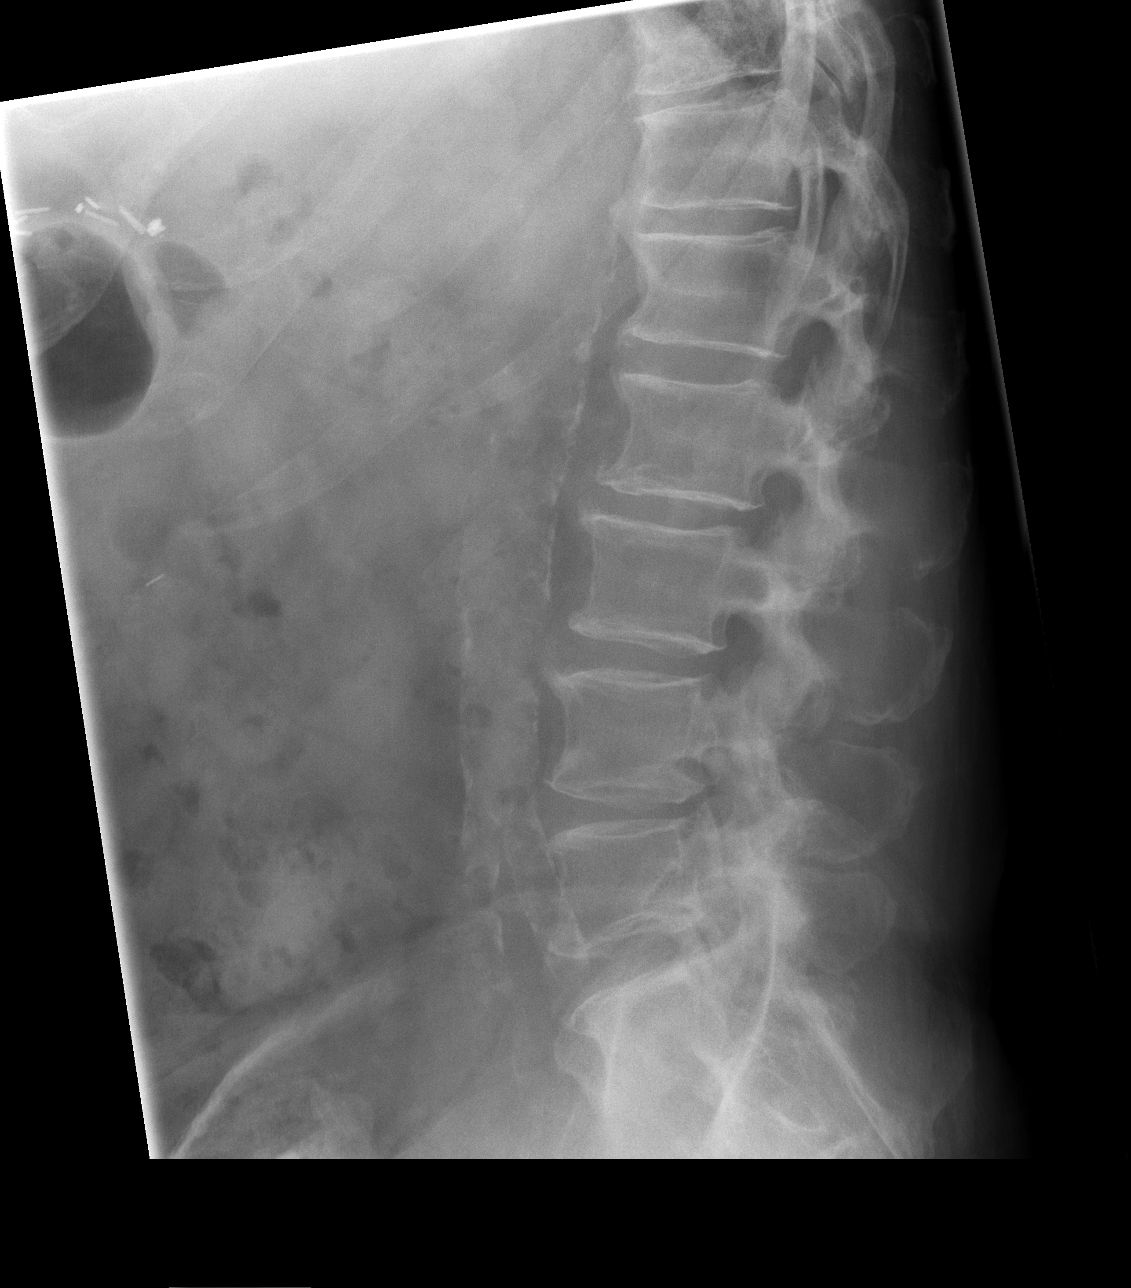

[t l-spine l5-s1 spot]
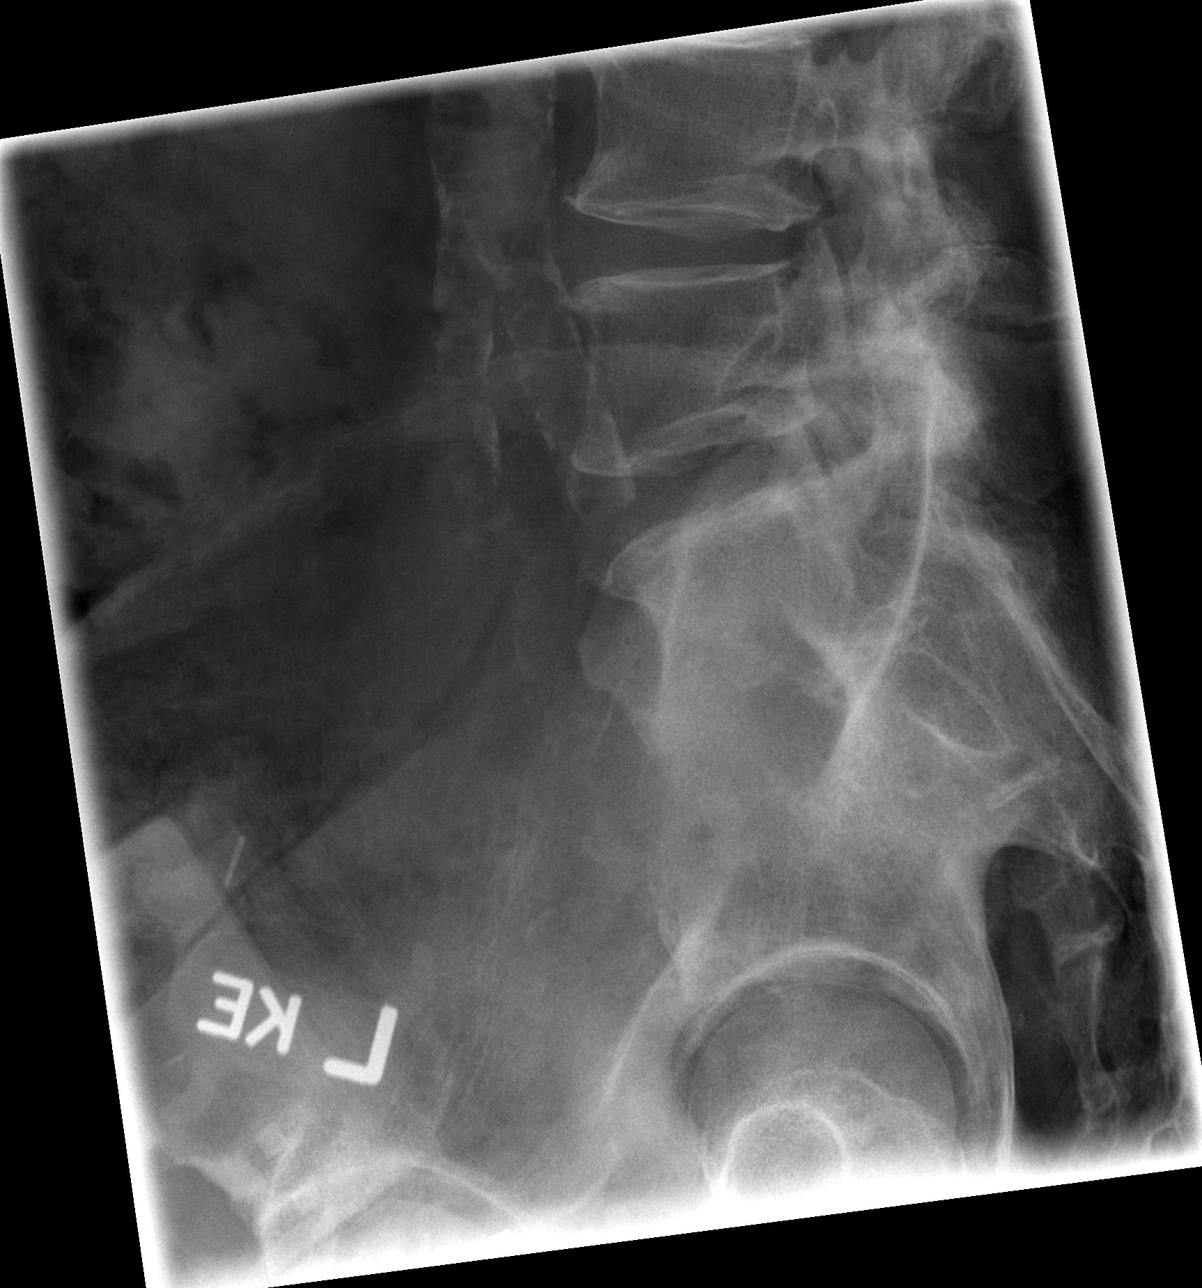

[4 of 4 positions shown; findings below may reference images not displayed]

FINDINGS: Five lumbar type vertebral bodies are well visualized. Vertebral
body height is well maintained. No pars defects are noted. No
anterolisthesis is seen. Multilevel facet hypertrophic changes and
osteophytic changes are seen.
IMPRESSION: Degenerative change without acute abnormality.

## 2022-07-10 DIAGNOSIS — Z01818 Encounter for other preprocedural examination: Secondary | ICD-10-CM | POA: Diagnosis not present

## 2023-06-01 ENCOUNTER — Emergency Department (HOSPITAL_COMMUNITY): Payer: Non-veteran care

## 2023-06-01 ENCOUNTER — Encounter (HOSPITAL_COMMUNITY): Payer: Self-pay

## 2023-06-01 ENCOUNTER — Other Ambulatory Visit: Payer: Self-pay

## 2023-06-01 DIAGNOSIS — R188 Other ascites: Secondary | ICD-10-CM | POA: Diagnosis present

## 2023-06-01 DIAGNOSIS — E1122 Type 2 diabetes mellitus with diabetic chronic kidney disease: Secondary | ICD-10-CM | POA: Diagnosis present

## 2023-06-01 DIAGNOSIS — J9601 Acute respiratory failure with hypoxia: Secondary | ICD-10-CM | POA: Diagnosis present

## 2023-06-01 DIAGNOSIS — E872 Acidosis, unspecified: Secondary | ICD-10-CM | POA: Diagnosis present

## 2023-06-01 DIAGNOSIS — Z7901 Long term (current) use of anticoagulants: Secondary | ICD-10-CM

## 2023-06-01 DIAGNOSIS — I480 Paroxysmal atrial fibrillation: Secondary | ICD-10-CM | POA: Diagnosis present

## 2023-06-01 DIAGNOSIS — M109 Gout, unspecified: Secondary | ICD-10-CM | POA: Diagnosis present

## 2023-06-01 DIAGNOSIS — I7789 Other specified disorders of arteries and arterioles: Secondary | ICD-10-CM | POA: Diagnosis present

## 2023-06-01 DIAGNOSIS — Z9911 Dependence on respirator [ventilator] status: Secondary | ICD-10-CM | POA: Diagnosis not present

## 2023-06-01 DIAGNOSIS — Z515 Encounter for palliative care: Secondary | ICD-10-CM | POA: Diagnosis not present

## 2023-06-01 DIAGNOSIS — I350 Nonrheumatic aortic (valve) stenosis: Secondary | ICD-10-CM | POA: Diagnosis present

## 2023-06-01 DIAGNOSIS — J95859 Other complication of respirator [ventilator]: Secondary | ICD-10-CM | POA: Diagnosis present

## 2023-06-01 DIAGNOSIS — I214 Non-ST elevation (NSTEMI) myocardial infarction: Principal | ICD-10-CM | POA: Diagnosis present

## 2023-06-01 DIAGNOSIS — S2249XA Multiple fractures of ribs, unspecified side, initial encounter for closed fracture: Secondary | ICD-10-CM | POA: Diagnosis not present

## 2023-06-01 DIAGNOSIS — R918 Other nonspecific abnormal finding of lung field: Secondary | ICD-10-CM | POA: Diagnosis not present

## 2023-06-01 DIAGNOSIS — Z4682 Encounter for fitting and adjustment of non-vascular catheter: Secondary | ICD-10-CM | POA: Diagnosis not present

## 2023-06-01 DIAGNOSIS — R001 Bradycardia, unspecified: Secondary | ICD-10-CM | POA: Diagnosis not present

## 2023-06-01 DIAGNOSIS — Z9181 History of falling: Secondary | ICD-10-CM

## 2023-06-01 DIAGNOSIS — G9340 Encephalopathy, unspecified: Secondary | ICD-10-CM | POA: Diagnosis not present

## 2023-06-01 DIAGNOSIS — J1569 Pneumonia due to other gram-negative bacteria: Secondary | ICD-10-CM | POA: Diagnosis present

## 2023-06-01 DIAGNOSIS — Z7984 Long term (current) use of oral hypoglycemic drugs: Secondary | ICD-10-CM

## 2023-06-01 DIAGNOSIS — I499 Cardiac arrhythmia, unspecified: Secondary | ICD-10-CM | POA: Diagnosis not present

## 2023-06-01 DIAGNOSIS — N400 Enlarged prostate without lower urinary tract symptoms: Secondary | ICD-10-CM | POA: Diagnosis present

## 2023-06-01 DIAGNOSIS — R739 Hyperglycemia, unspecified: Secondary | ICD-10-CM | POA: Diagnosis not present

## 2023-06-01 DIAGNOSIS — R0989 Other specified symptoms and signs involving the circulatory and respiratory systems: Secondary | ICD-10-CM | POA: Diagnosis not present

## 2023-06-01 DIAGNOSIS — Z85038 Personal history of other malignant neoplasm of large intestine: Secondary | ICD-10-CM

## 2023-06-01 DIAGNOSIS — Z794 Long term (current) use of insulin: Secondary | ICD-10-CM

## 2023-06-01 DIAGNOSIS — I129 Hypertensive chronic kidney disease with stage 1 through stage 4 chronic kidney disease, or unspecified chronic kidney disease: Secondary | ICD-10-CM | POA: Diagnosis present

## 2023-06-01 DIAGNOSIS — E1165 Type 2 diabetes mellitus with hyperglycemia: Secondary | ICD-10-CM | POA: Diagnosis present

## 2023-06-01 DIAGNOSIS — I2510 Atherosclerotic heart disease of native coronary artery without angina pectoris: Secondary | ICD-10-CM | POA: Diagnosis present

## 2023-06-01 DIAGNOSIS — K7460 Unspecified cirrhosis of liver: Secondary | ICD-10-CM | POA: Diagnosis present

## 2023-06-01 DIAGNOSIS — Z87891 Personal history of nicotine dependence: Secondary | ICD-10-CM

## 2023-06-01 DIAGNOSIS — D638 Anemia in other chronic diseases classified elsewhere: Secondary | ICD-10-CM | POA: Diagnosis present

## 2023-06-01 DIAGNOSIS — E669 Obesity, unspecified: Secondary | ICD-10-CM | POA: Diagnosis present

## 2023-06-01 DIAGNOSIS — T4275XA Adverse effect of unspecified antiepileptic and sedative-hypnotic drugs, initial encounter: Secondary | ICD-10-CM | POA: Diagnosis present

## 2023-06-01 DIAGNOSIS — I952 Hypotension due to drugs: Secondary | ICD-10-CM | POA: Diagnosis present

## 2023-06-01 DIAGNOSIS — N179 Acute kidney failure, unspecified: Secondary | ICD-10-CM | POA: Diagnosis present

## 2023-06-01 DIAGNOSIS — Z9049 Acquired absence of other specified parts of digestive tract: Secondary | ICD-10-CM

## 2023-06-01 DIAGNOSIS — Z951 Presence of aortocoronary bypass graft: Secondary | ICD-10-CM

## 2023-06-01 DIAGNOSIS — R140 Abdominal distension (gaseous): Secondary | ICD-10-CM | POA: Diagnosis not present

## 2023-06-01 DIAGNOSIS — I469 Cardiac arrest, cause unspecified: Principal | ICD-10-CM | POA: Diagnosis present

## 2023-06-01 DIAGNOSIS — I462 Cardiac arrest due to underlying cardiac condition: Secondary | ICD-10-CM | POA: Diagnosis present

## 2023-06-01 DIAGNOSIS — K219 Gastro-esophageal reflux disease without esophagitis: Secondary | ICD-10-CM | POA: Diagnosis present

## 2023-06-01 DIAGNOSIS — G931 Anoxic brain damage, not elsewhere classified: Secondary | ICD-10-CM | POA: Diagnosis present

## 2023-06-01 DIAGNOSIS — E7800 Pure hypercholesterolemia, unspecified: Secondary | ICD-10-CM | POA: Diagnosis present

## 2023-06-01 DIAGNOSIS — Z79899 Other long term (current) drug therapy: Secondary | ICD-10-CM

## 2023-06-01 DIAGNOSIS — J690 Pneumonitis due to inhalation of food and vomit: Secondary | ICD-10-CM | POA: Diagnosis not present

## 2023-06-01 DIAGNOSIS — R55 Syncope and collapse: Secondary | ICD-10-CM | POA: Diagnosis not present

## 2023-06-01 DIAGNOSIS — N1832 Chronic kidney disease, stage 3b: Secondary | ICD-10-CM | POA: Diagnosis present

## 2023-06-01 DIAGNOSIS — R34 Anuria and oliguria: Secondary | ICD-10-CM | POA: Diagnosis present

## 2023-06-01 DIAGNOSIS — Z6837 Body mass index (BMI) 37.0-37.9, adult: Secondary | ICD-10-CM

## 2023-06-01 DIAGNOSIS — R0902 Hypoxemia: Secondary | ICD-10-CM | POA: Diagnosis not present

## 2023-06-01 DIAGNOSIS — R569 Unspecified convulsions: Secondary | ICD-10-CM | POA: Diagnosis not present

## 2023-06-01 DIAGNOSIS — R404 Transient alteration of awareness: Secondary | ICD-10-CM | POA: Diagnosis not present

## 2023-06-01 DIAGNOSIS — I4510 Unspecified right bundle-branch block: Secondary | ICD-10-CM | POA: Diagnosis present

## 2023-06-01 LAB — LIPID PANEL
Cholesterol: 90 mg/dL (ref 0–200)
HDL: 37 mg/dL — ABNORMAL LOW (ref >40)
LDL Cholesterol: 30 mg/dL (ref 0–99)
Total CHOL/HDL Ratio: 2.4 {ratio}
Triglycerides: 114 mg/dL (ref <150)
VLDL: 23 mg/dL (ref 0–40)

## 2023-06-01 LAB — HEPATITIS PANEL, ACUTE
HCV Ab: NONREACTIVE
Hep A IgM: NONREACTIVE
Hep B C IgM: NONREACTIVE
Hepatitis B Surface Ag: NONREACTIVE

## 2023-06-01 LAB — BASIC METABOLIC PANEL
Anion gap: 15 (ref 5–15)
BUN: 14 mg/dL (ref 8–23)
CO2: 20 mmol/L — ABNORMAL LOW (ref 22–32)
Calcium: 8.3 mg/dL — ABNORMAL LOW (ref 8.9–10.3)
Chloride: 107 mmol/L (ref 98–111)
Creatinine, Ser: 1.81 mg/dL — ABNORMAL HIGH (ref 0.61–1.24)
GFR, Estimated: 37 mL/min — ABNORMAL LOW (ref >60)
Glucose, Bld: 178 mg/dL — ABNORMAL HIGH (ref 70–99)
Potassium: 4 mmol/L (ref 3.5–5.1)
Sodium: 142 mmol/L (ref 135–145)

## 2023-06-01 LAB — I-STAT ARTERIAL BLOOD GAS, ED
Acid-base deficit: 8 mmol/L — ABNORMAL HIGH (ref 0.0–2.0)
Bicarbonate: 17.6 mmol/L — ABNORMAL LOW (ref 20.0–28.0)
Calcium, Ion: 1.12 mmol/L — ABNORMAL LOW (ref 1.15–1.40)
HCT: 31 % — ABNORMAL LOW (ref 39.0–52.0)
Hemoglobin: 10.5 g/dL — ABNORMAL LOW (ref 13.0–17.0)
O2 Saturation: 100 %
Patient temperature: 96.7
Potassium: 3.5 mmol/L (ref 3.5–5.1)
Sodium: 138 mmol/L (ref 135–145)
TCO2: 19 mmol/L — ABNORMAL LOW (ref 22–32)
pCO2 arterial: 34.1 mm[Hg] (ref 32–48)
pH, Arterial: 7.316 — ABNORMAL LOW (ref 7.35–7.45)
pO2, Arterial: 195 mm[Hg] — ABNORMAL HIGH (ref 83–108)

## 2023-06-01 LAB — HEPATIC FUNCTION PANEL
ALT: 15 U/L (ref 0–44)
AST: 32 U/L (ref 15–41)
Albumin: 2.8 g/dL — ABNORMAL LOW (ref 3.5–5.0)
Alkaline Phosphatase: 110 U/L (ref 38–126)
Bilirubin, Direct: 0.3 mg/dL — ABNORMAL HIGH (ref 0.0–0.2)
Indirect Bilirubin: 0.6 mg/dL (ref 0.3–0.9)
Total Bilirubin: 0.9 mg/dL (ref 0.3–1.2)
Total Protein: 5.3 g/dL — ABNORMAL LOW (ref 6.5–8.1)

## 2023-06-01 LAB — TROPONIN I (HIGH SENSITIVITY)
Troponin I (High Sensitivity): 27 ng/L — ABNORMAL HIGH (ref <18)
Troponin I (High Sensitivity): 155 ng/L (ref <18)
Troponin I (High Sensitivity): 206 ng/L (ref <18)
Troponin I (High Sensitivity): 282 ng/L (ref <18)

## 2023-06-01 LAB — I-STAT CHEM 8, ED
BUN: 15 mg/dL (ref 8–23)
Calcium, Ion: 1.14 mmol/L — ABNORMAL LOW (ref 1.15–1.40)
Chloride: 106 mmol/L (ref 98–111)
Creatinine, Ser: 1.7 mg/dL — ABNORMAL HIGH (ref 0.61–1.24)
Glucose, Bld: 170 mg/dL — ABNORMAL HIGH (ref 70–99)
HCT: 33 % — ABNORMAL LOW (ref 39.0–52.0)
Hemoglobin: 11.2 g/dL — ABNORMAL LOW (ref 13.0–17.0)
Potassium: 4 mmol/L (ref 3.5–5.1)
Sodium: 140 mmol/L (ref 135–145)
TCO2: 20 mmol/L — ABNORMAL LOW (ref 22–32)

## 2023-06-01 LAB — URINALYSIS, ROUTINE W REFLEX MICROSCOPIC
Bilirubin Urine: NEGATIVE
Glucose, UA: 50 mg/dL — AB
Ketones, ur: NEGATIVE mg/dL
Leukocytes,Ua: NEGATIVE
Nitrite: NEGATIVE
Protein, ur: 100 mg/dL — AB
Specific Gravity, Urine: 1.012 (ref 1.005–1.030)
pH: 5 (ref 5.0–8.0)

## 2023-06-01 LAB — TSH: TSH: 1.586 u[IU]/mL (ref 0.350–4.500)

## 2023-06-01 LAB — MAGNESIUM: Magnesium: 1.7 mg/dL (ref 1.7–2.4)

## 2023-06-01 LAB — AMMONIA: Ammonia: 31 umol/L (ref 9–35)

## 2023-06-01 LAB — CBC
HCT: 33.8 % — ABNORMAL LOW (ref 39.0–52.0)
Hemoglobin: 10.4 g/dL — ABNORMAL LOW (ref 13.0–17.0)
MCH: 29 pg (ref 26.0–34.0)
MCHC: 30.8 g/dL (ref 30.0–36.0)
MCV: 94.2 fL (ref 80.0–100.0)
Platelets: 172 10*3/uL (ref 150–400)
RBC: 3.59 MIL/uL — ABNORMAL LOW (ref 4.22–5.81)
RDW: 16 % — ABNORMAL HIGH (ref 11.5–15.5)
WBC: 11.8 10*3/uL — ABNORMAL HIGH (ref 4.0–10.5)
nRBC: 0 % (ref 0.0–0.2)

## 2023-06-01 LAB — HEMOGLOBIN A1C
Hgb A1c MFr Bld: 6.7 % — ABNORMAL HIGH (ref 4.8–5.6)
Mean Plasma Glucose: 145.59 mg/dL

## 2023-06-01 LAB — APTT: aPTT: 28 s (ref 24–36)

## 2023-06-01 LAB — LACTIC ACID, PLASMA: Lactic Acid, Venous: 4.8 mmol/L (ref 0.5–1.9)

## 2023-06-01 LAB — GLUCOSE, CAPILLARY
Glucose-Capillary: 219 mg/dL — ABNORMAL HIGH (ref 70–99)
Glucose-Capillary: 220 mg/dL — ABNORMAL HIGH (ref 70–99)

## 2023-06-01 LAB — PROTIME-INR
INR: 1.3 — ABNORMAL HIGH (ref 0.8–1.2)
Prothrombin Time: 16 s — ABNORMAL HIGH (ref 11.4–15.2)

## 2023-06-01 LAB — HEPARIN LEVEL (UNFRACTIONATED): Heparin Unfractionated: >1.1 [IU]/mL — ABNORMAL HIGH (ref 0.30–0.70)

## 2023-06-01 MED ORDER — ORAL CARE MOUTH RINSE
15.0000 mL | OROMUCOSAL | Status: DC
  Administered 2023-06-01 – 2023-06-04 (×34): 15 mL via OROMUCOSAL

## 2023-06-01 MED ORDER — ROCURONIUM BROMIDE 50 MG/5ML IV SOLN
INTRAVENOUS | Status: DC | PRN
Start: 2023-06-01 — End: 2023-06-01
  Administered 2023-06-01: 100 mg via INTRAVENOUS

## 2023-06-01 MED ORDER — DOCUSATE SODIUM 50 MG/5ML PO LIQD: 100.0000 mg | Freq: Two times a day (BID) | ORAL | Status: DC | PRN

## 2023-06-01 MED ORDER — FENTANYL CITRATE PF 50 MCG/ML IJ SOSY
25.0000 ug | PREFILLED_SYRINGE | INTRAMUSCULAR | Status: DC | PRN
  Administered 2023-06-01 (×2): 25 ug via INTRAVENOUS
  Filled 2023-06-01: qty 1

## 2023-06-01 MED ORDER — ASPIRIN 81 MG PO CHEW
81.0000 mg | CHEWABLE_TABLET | Freq: Every day | ORAL | Status: DC
  Administered 2023-06-02 – 2023-06-04 (×3): 81 mg
  Filled 2023-06-01 (×3): qty 1

## 2023-06-01 MED ORDER — CHLORHEXIDINE GLUCONATE CLOTH 2 % EX PADS: 6.0000 | MEDICATED_PAD | Freq: Every day | CUTANEOUS | Status: DC

## 2023-06-01 MED ORDER — HEPARIN (PORCINE) 25000 UT/250ML-% IV SOLN
1400.0000 [IU]/h | INTRAVENOUS | Status: DC
  Administered 2023-06-01: 1400 [IU]/h via INTRAVENOUS
  Administered 2023-06-02 – 2023-06-03 (×2): 1300 [IU]/h via INTRAVENOUS
  Administered 2023-06-04: 1400 [IU]/h via INTRAVENOUS
  Filled 2023-06-01 (×5): qty 250

## 2023-06-01 MED ORDER — MAGNESIUM SULFATE 2 GM/50ML IV SOLN
2.0000 g | Freq: Once | INTRAVENOUS | Status: AC | PRN
  Administered 2023-06-03: 2 g via INTRAVENOUS
  Filled 2023-06-01: qty 50

## 2023-06-01 MED ORDER — ONDANSETRON HCL 4 MG/2ML IJ SOLN: 4.0000 mg | Freq: Four times a day (QID) | INTRAMUSCULAR | Status: DC | PRN

## 2023-06-01 MED ORDER — FAMOTIDINE 20 MG PO TABS
20.0000 mg | ORAL_TABLET | Freq: Two times a day (BID) | ORAL | Status: DC
  Administered 2023-06-02 – 2023-06-04 (×5): 20 mg
  Filled 2023-06-01 (×5): qty 1

## 2023-06-01 MED ORDER — LACTATED RINGERS IV SOLN: INTRAVENOUS | Status: AC

## 2023-06-01 MED ORDER — NOREPINEPHRINE 4 MG/250ML-% IV SOLN
2.0000 ug/min | INTRAVENOUS | Status: DC
  Administered 2023-06-02: 4 ug/min via INTRAVENOUS
  Administered 2023-06-02: 6 ug/min via INTRAVENOUS
  Administered 2023-06-02: 10 ug/min via INTRAVENOUS
  Administered 2023-06-03: 8 ug/min via INTRAVENOUS
  Administered 2023-06-03: 5 ug/min via INTRAVENOUS
  Filled 2023-06-01 (×5): qty 250

## 2023-06-01 MED ORDER — FENTANYL 2500MCG IN NS 250ML (10MCG/ML) PREMIX INFUSION
25.0000 ug/h | INTRAVENOUS | Status: DC
  Administered 2023-06-01: 50 ug/h via INTRAVENOUS
  Administered 2023-06-02: 175 ug/h via INTRAVENOUS
  Administered 2023-06-02: 200 ug/h via INTRAVENOUS
  Administered 2023-06-03: 100 ug/h via INTRAVENOUS
  Filled 2023-06-01 (×4): qty 250

## 2023-06-01 MED ORDER — INSULIN ASPART 100 UNIT/ML IJ SOLN
0.0000 [IU] | INTRAMUSCULAR | Status: DC
  Administered 2023-06-01 (×2): 3 [IU] via SUBCUTANEOUS
  Administered 2023-06-02: 1 [IU] via SUBCUTANEOUS
  Administered 2023-06-02 – 2023-06-03 (×5): 2 [IU] via SUBCUTANEOUS
  Administered 2023-06-03 (×2): 1 [IU] via SUBCUTANEOUS
  Administered 2023-06-04: 2 [IU] via SUBCUTANEOUS
  Administered 2023-06-04 (×2): 3 [IU] via SUBCUTANEOUS
  Administered 2023-06-04 (×2): 2 [IU] via SUBCUTANEOUS

## 2023-06-01 MED ORDER — PROPOFOL 1000 MG/100ML IV EMUL
0.0000 ug/kg/min | INTRAVENOUS | Status: DC
  Administered 2023-06-01: 5 ug/kg/min via INTRAVENOUS
  Administered 2023-06-02: 20 ug/kg/min via INTRAVENOUS
  Administered 2023-06-02: 30 ug/kg/min via INTRAVENOUS
  Administered 2023-06-02: 20 ug/kg/min via INTRAVENOUS
  Administered 2023-06-02: 35 ug/kg/min via INTRAVENOUS
  Administered 2023-06-03 (×3): 20 ug/kg/min via INTRAVENOUS
  Administered 2023-06-03 (×2): 35 ug/kg/min via INTRAVENOUS
  Administered 2023-06-04: 30 ug/kg/min via INTRAVENOUS
  Administered 2023-06-04: 20 ug/kg/min via INTRAVENOUS
  Filled 2023-06-01 (×2): qty 100
  Filled 2023-06-01: qty 200
  Filled 2023-06-01 (×3): qty 100
  Filled 2023-06-01: qty 200
  Filled 2023-06-01 (×4): qty 100

## 2023-06-01 MED ORDER — LACTATED RINGERS IV BOLUS
1000.0000 mL | Freq: Once | INTRAVENOUS | Status: AC
  Administered 2023-06-01: 1000 mL via INTRAVENOUS

## 2023-06-01 MED ORDER — PROPOFOL 1000 MG/100ML IV EMUL
INTRAVENOUS | Status: AC
  Filled 2023-06-01: qty 100

## 2023-06-01 MED ORDER — POLYETHYLENE GLYCOL 3350 17 G PO PACK
17.0000 g | PACK | Freq: Every day | ORAL | Status: DC
  Administered 2023-06-02 – 2023-06-04 (×3): 17 g
  Filled 2023-06-01 (×3): qty 1

## 2023-06-01 MED ORDER — BUSPIRONE HCL 10 MG PO TABS: 30.0000 mg | ORAL_TABLET | Freq: Three times a day (TID) | ORAL | Status: AC | PRN

## 2023-06-01 MED ORDER — SODIUM CHLORIDE 0.9 % IV SOLN
INTRAVENOUS | Status: DC | PRN
Start: 2023-06-01 — End: 2023-06-01
  Administered 2023-06-01: 999 mL/h via INTRAVENOUS

## 2023-06-01 MED ORDER — IOHEXOL 350 MG/ML SOLN
60.0000 mL | Freq: Once | INTRAVENOUS | Status: AC | PRN
  Administered 2023-06-01: 60 mL via INTRAVENOUS

## 2023-06-01 MED ORDER — PHENYLEPHRINE HCL (PRESSORS) 10 MG/ML IV SOLN
INTRAVENOUS | Status: DC | PRN
Start: 2023-06-01 — End: 2023-06-01
  Administered 2023-06-01: 160 ug

## 2023-06-01 MED ORDER — ACETAMINOPHEN 160 MG/5ML PO SOLN: 650.0000 mg | ORAL | Status: DC | PRN

## 2023-06-01 MED ORDER — PHENYLEPHRINE 80 MCG/ML (10ML) SYRINGE FOR IV PUSH (FOR BLOOD PRESSURE SUPPORT): 160.0000 ug | PREFILLED_SYRINGE | Freq: Once | INTRAVENOUS | Status: DC

## 2023-06-01 MED ORDER — FENTANYL 2500MCG IN NS 250ML (10MCG/ML) PREMIX INFUSION
25.0000 ug/h | INTRAVENOUS | Status: DC
  Administered 2023-06-01: 50 ug/h via INTRAVENOUS
  Filled 2023-06-01: qty 250

## 2023-06-01 MED ORDER — POLYETHYLENE GLYCOL 3350 17 G PO PACK: 17.0000 g | PACK | Freq: Every day | ORAL | Status: DC

## 2023-06-01 MED ORDER — ETOMIDATE 2 MG/ML IV SOLN
INTRAVENOUS | Status: DC | PRN
Start: 2023-06-01 — End: 2023-06-01
  Administered 2023-06-01: 30 mg via INTRAVENOUS

## 2023-06-01 MED ORDER — DOCUSATE SODIUM 50 MG/5ML PO LIQD
100.0000 mg | Freq: Two times a day (BID) | ORAL | Status: DC
  Administered 2023-06-02 – 2023-06-04 (×5): 100 mg
  Filled 2023-06-01 (×5): qty 10

## 2023-06-01 MED ORDER — ACETAMINOPHEN 325 MG PO TABS: 650.0000 mg | ORAL_TABLET | ORAL | Status: DC | PRN

## 2023-06-01 MED ORDER — SODIUM CHLORIDE 0.9 % IV SOLN
250.0000 mL | INTRAVENOUS | Status: AC
  Administered 2023-06-01: 250 mL via INTRAVENOUS

## 2023-06-01 MED ORDER — MAGNESIUM SULFATE 2 GM/50ML IV SOLN
2.0000 g | Freq: Once | INTRAVENOUS | Status: AC
  Administered 2023-06-01: 2 g via INTRAVENOUS
  Filled 2023-06-01: qty 50

## 2023-06-01 MED ORDER — ACETAMINOPHEN 325 MG PO TABS: 650.0000 mg | ORAL_TABLET | ORAL | Status: AC

## 2023-06-01 MED ORDER — FENTANYL BOLUS VIA INFUSION
25.0000 ug | INTRAVENOUS | Status: DC | PRN
  Administered 2023-06-03: 50 ug via INTRAVENOUS

## 2023-06-01 MED ORDER — ACETAMINOPHEN 650 MG RE SUPP
650.0000 mg | RECTAL | Status: AC
  Administered 2023-06-01 – 2023-06-02 (×2): 650 mg via RECTAL
  Filled 2023-06-01 (×2): qty 1

## 2023-06-01 MED ORDER — ACETAMINOPHEN 650 MG RE SUPP: 650.0000 mg | RECTAL | Status: DC | PRN

## 2023-06-01 MED ORDER — POTASSIUM CHLORIDE 10 MEQ/100ML IV SOLN
10.0000 meq | INTRAVENOUS | Status: AC
  Administered 2023-06-02 (×2): 10 meq via INTRAVENOUS
  Filled 2023-06-01 (×2): qty 100

## 2023-06-01 MED ORDER — ACETAMINOPHEN 160 MG/5ML PO SOLN
650.0000 mg | ORAL | Status: AC
  Administered 2023-06-02 – 2023-06-03 (×7): 650 mg
  Filled 2023-06-01 (×7): qty 20.3

## 2023-06-01 MED ORDER — FENTANYL BOLUS VIA INFUSION: 25.0000 ug | INTRAVENOUS | Status: DC | PRN

## 2023-06-01 MED ORDER — SODIUM CHLORIDE 0.9 % IV SOLN
2.0000 g | INTRAVENOUS | Status: DC
  Administered 2023-06-01 – 2023-06-03 (×3): 2 g via INTRAVENOUS
  Filled 2023-06-01 (×3): qty 20

## 2023-06-01 MED ORDER — ORAL CARE MOUTH RINSE: 15.0000 mL | OROMUCOSAL | Status: DC | PRN

## 2023-06-01 MED ORDER — POLYETHYLENE GLYCOL 3350 17 G PO PACK: 17.0000 g | PACK | Freq: Every day | ORAL | Status: DC | PRN

## 2023-06-01 MED ORDER — FENTANYL CITRATE PF 50 MCG/ML IJ SOSY
25.0000 ug | PREFILLED_SYRINGE | INTRAMUSCULAR | Status: DC | PRN
  Administered 2023-06-04: 100 ug via INTRAVENOUS
  Filled 2023-06-01: qty 2

## 2023-06-01 MED ORDER — FUROSEMIDE 10 MG/ML IJ SOLN
40.0000 mg | Freq: Once | INTRAMUSCULAR | Status: AC
  Administered 2023-06-01: 40 mg via INTRAVENOUS
  Filled 2023-06-01: qty 4

## 2023-06-01 MED ORDER — EPINEPHRINE HCL 5 MG/250ML IV SOLN IN NS
0.5000 ug/min | INTRAVENOUS | Status: DC
  Administered 2023-06-01: 5 ug/min via INTRAVENOUS

## 2023-06-01 NOTE — ED Notes (Signed)
0.61 - 1.24 mg/dL   Glucose, Bld 284 (H) 70 - 99 mg/dL    Comment: Glucose reference range applies only to samples taken after fasting for at least 8 hours.   Calcium, Ion 1.14 (L) 1.15 - 1.40 mmol/L   TCO2 20 (L) 22 - 32 mmol/L   Hemoglobin 11.2 (L) 13.0 - 17.0 g/dL   HCT 13.2 (L) 44.0 - 10.2 %  Basic metabolic panel     Status: Abnormal   Collection Time: 06/11/2023  5:31 PM  Result Value Ref Range   Sodium 142 135 - 145 mmol/L   Potassium 4.0 3.5 - 5.1 mmol/L   Chloride 107 98 - 111 mmol/L   CO2 20 (L) 22 - 32 mmol/L   Glucose, Bld 178 (H) 70 - 99 mg/dL    Comment: Glucose reference range applies only to samples taken after fasting for at least 8 hours.    BUN 14 8 - 23 mg/dL   Creatinine, Ser 7.25 (H) 0.61 - 1.24 mg/dL   Calcium 8.3 (L) 8.9 - 10.3 mg/dL   GFR, Estimated 37 (L) >60 mL/min    Comment: (NOTE) Calculated using the CKD-EPI Creatinine Equation (2021)    Anion gap 15 5 - 15    Comment: Performed at [facility], [address] [place], [place] [address]  CBC     Status: Abnormal   Collection Time: 05/24/2023  5:31 PM  Result Value Ref Range   WBC 11.8 (H) 4.0 - 10.5 K/uL   RBC 3.59 (L) 4.22 - 5.81 MIL/uL   Hemoglobin 10.4 (L) 13.0 - 17.0 g/dL   HCT 03.4 (L) 74.2 - 59.5 %   MCV 94.2 80.0 - 100.0 fL   MCH 29.0 26.0 - 34.0 pg   MCHC 30.8 30.0 - 36.0 g/dL   RDW 63.8 (H) 75.6 - 43.3 %   Platelets 172 150 - 400 K/uL   nRBC 0.0 0.0 - 0.2 %    Comment: Performed at [facility], [address], [place], [place] [address]  Troponin I (High Sensitivity)     Status: Abnormal   Collection Time: 05/21/2023  5:31 PM  Result Value Ref Range   Troponin I (High Sensitivity) 27 (H) <18 ng/L    Comment: (NOTE) Elevated high sensitivity troponin I (hsTnI) values and significant  changes across serial measurements may suggest ACS but many other  chronic and acute conditions are known to elevate hsTnI results.  Refer to the "Links" section for chest pain algorithms and additional  guidance. Performed at [facility], [address] [place], [place] [address]   Magnesium     Status: None   Collection Time: 05/25/2023  5:31 PM  Result Value Ref Range   Magnesium 1.7 1.7 - 2.4 mg/dL    Comment: Performed at [facility], [address] [place], [place] [address]  Urinalysis, Routine w reflex microscopic -Urine, Unspecified Source     Status: Abnormal   Collection Time: 05/31/2023  6:17 PM  Result Value Ref Range   Color, Urine YELLOW YELLOW   APPearance HAZY (A) CLEAR   Specific Gravity, Urine 1.012 1.005 - 1.030   pH 5.0 5.0 - 8.0   Glucose, UA 50 (A) NEGATIVE mg/dL   Hgb urine dipstick SMALL (A) NEGATIVE    Bilirubin Urine NEGATIVE NEGATIVE   Ketones, ur NEGATIVE NEGATIVE mg/dL   Protein, ur 601 (A) NEGATIVE mg/dL   Nitrite NEGATIVE NEGATIVE   Leukocytes,Ua NEGATIVE NEGATIVE  On: 06/14/2023 19:05   DG Chest Port 1 View  Result Date: 06/15/2023 CLINICAL DATA:  Status post intubation. EXAM: PORTABLE CHEST 1 VIEW COMPARISON:  Earlier today, 5:38 p.m. FINDINGS: 5:43 p.m. Status post median sternotomy and CABG procedure. Stable cardiomediastinal contours. Endotracheal tube has been advanced and is now 3.6 cm above the carina. Lung volumes are low. Asymmetric increase interstitial markings noted within the left mid and left lower lung. Visualized osseous structures are unremarkable. IMPRESSION: 1. Endotracheal tube has been advanced and is now 3.6 cm above the carina. 2. Asymmetric increase interstitial markings within the left mid and left lower lung. Electronically Signed   By: Signa Kell M.D.   On: 05/30/2023 19:05    Pending Labs Unresulted Labs (From admission, onward)     Start     Ordered   06/02/23 0500  CBC  Tomorrow morning,   R        06/14/2023 2010   06/02/23 0500  Basic metabolic panel  Tomorrow morning,   R        06/18/2023 2010   06/02/23 0500  Magnesium  Tomorrow morning,   R        05/22/2023 2010   06/11/2023 2016  APTT  Once,   R        06/19/2023 2015   06/20/2023 2015  Protime-INR  Once,   R        06/07/2023 2015   05/24/2023 2013  Hepatic function panel  Add-on,   AD        06/06/2023 2012   05/30/2023 2013  Lactic acid, plasma  (Lactic Acid)  STAT Now then every 3 hours,   R (with STAT occurrences)      05/21/2023 2012   06/11/2023 2012  Hemoglobin A1c  Once,   R       Comments: To assess prior glycemic control    06/16/2023 2011   05/29/2023 1840  Blood gas, arterial  Once,   R         06/15/2023 1844            Vitals/Pain Today's Vitals   06/15/2023 1805 06/19/2023 1900 06/10/2023 1900 06/20/2023 1945  BP: (!) 144/72 (!) 152/71  (!) 164/68  Pulse: 86   (!) 109  Resp: 19 18  20   Temp:  (!) 96.7 F (35.9 C)  (!) 96.6 F (35.9 C)  SpO2: 100% 100%  100%  Weight:   111.6 kg   Height:   5\' 10"  (1.778 m)     Isolation Precautions No active isolations  Medications Medications  0.9 %  sodium chloride infusion (0 mLs Intravenous Stopped 06/05/2023 1811)  docusate (COLACE) 50 MG/5ML liquid 100 mg (has no administration in time range)  polyethylene glycol (MIRALAX / GLYCOLAX) packet 17 g (has no administration in time range)  famotidine (PEPCID) tablet 20 mg (has no administration in time range)  docusate (COLACE) 50 MG/5ML liquid 100 mg (has no administration in time range)  polyethylene glycol (MIRALAX / GLYCOLAX) packet 17 g (has no administration in time range)  fentaNYL (SUBLIMAZE) injection 25 mcg (has no administration in time range)  fentaNYL (SUBLIMAZE) injection 25-100 mcg (has no administration in time range)  insulin aspart (novoLOG) injection 0-9 Units (has no administration in time range)  magnesium sulfate IVPB 2 g 50 mL (has no administration in time range)  iohexol (OMNIPAQUE) 350 MG/ML injection 60 mL (60 mLs Intravenous Contrast Given 06/17/2023 1903)    Mobility walks  On: 06/14/2023 19:05   DG Chest Port 1 View  Result Date: 06/15/2023 CLINICAL DATA:  Status post intubation. EXAM: PORTABLE CHEST 1 VIEW COMPARISON:  Earlier today, 5:38 p.m. FINDINGS: 5:43 p.m. Status post median sternotomy and CABG procedure. Stable cardiomediastinal contours. Endotracheal tube has been advanced and is now 3.6 cm above the carina. Lung volumes are low. Asymmetric increase interstitial markings noted within the left mid and left lower lung. Visualized osseous structures are unremarkable. IMPRESSION: 1. Endotracheal tube has been advanced and is now 3.6 cm above the carina. 2. Asymmetric increase interstitial markings within the left mid and left lower lung. Electronically Signed   By: Signa Kell M.D.   On: 05/30/2023 19:05    Pending Labs Unresulted Labs (From admission, onward)     Start     Ordered   06/02/23 0500  CBC  Tomorrow morning,   R        06/14/2023 2010   06/02/23 0500  Basic metabolic panel  Tomorrow morning,   R        06/18/2023 2010   06/02/23 0500  Magnesium  Tomorrow morning,   R        05/22/2023 2010   06/11/2023 2016  APTT  Once,   R        06/19/2023 2015   06/20/2023 2015  Protime-INR  Once,   R        06/07/2023 2015   05/24/2023 2013  Hepatic function panel  Add-on,   AD        06/06/2023 2012   05/30/2023 2013  Lactic acid, plasma  (Lactic Acid)  STAT Now then every 3 hours,   R (with STAT occurrences)      05/21/2023 2012   06/11/2023 2012  Hemoglobin A1c  Once,   R       Comments: To assess prior glycemic control    06/16/2023 2011   05/29/2023 1840  Blood gas, arterial  Once,   R         06/15/2023 1844            Vitals/Pain Today's Vitals   06/15/2023 1805 06/19/2023 1900 06/10/2023 1900 06/20/2023 1945  BP: (!) 144/72 (!) 152/71  (!) 164/68  Pulse: 86   (!) 109  Resp: 19 18  20   Temp:  (!) 96.7 F (35.9 C)  (!) 96.6 F (35.9 C)  SpO2: 100% 100%  100%  Weight:   111.6 kg   Height:   5\' 10"  (1.778 m)     Isolation Precautions No active isolations  Medications Medications  0.9 %  sodium chloride infusion (0 mLs Intravenous Stopped 06/05/2023 1811)  docusate (COLACE) 50 MG/5ML liquid 100 mg (has no administration in time range)  polyethylene glycol (MIRALAX / GLYCOLAX) packet 17 g (has no administration in time range)  famotidine (PEPCID) tablet 20 mg (has no administration in time range)  docusate (COLACE) 50 MG/5ML liquid 100 mg (has no administration in time range)  polyethylene glycol (MIRALAX / GLYCOLAX) packet 17 g (has no administration in time range)  fentaNYL (SUBLIMAZE) injection 25 mcg (has no administration in time range)  fentaNYL (SUBLIMAZE) injection 25-100 mcg (has no administration in time range)  insulin aspart (novoLOG) injection 0-9 Units (has no administration in time range)  magnesium sulfate IVPB 2 g 50 mL (has no administration in time range)  iohexol (OMNIPAQUE) 350 MG/ML injection 60 mL (60 mLs Intravenous Contrast Given 06/17/2023 1903)    Mobility walks  0.61 - 1.24 mg/dL   Glucose, Bld 284 (H) 70 - 99 mg/dL    Comment: Glucose reference range applies only to samples taken after fasting for at least 8 hours.   Calcium, Ion 1.14 (L) 1.15 - 1.40 mmol/L   TCO2 20 (L) 22 - 32 mmol/L   Hemoglobin 11.2 (L) 13.0 - 17.0 g/dL   HCT 13.2 (L) 44.0 - 10.2 %  Basic metabolic panel     Status: Abnormal   Collection Time: 06/11/2023  5:31 PM  Result Value Ref Range   Sodium 142 135 - 145 mmol/L   Potassium 4.0 3.5 - 5.1 mmol/L   Chloride 107 98 - 111 mmol/L   CO2 20 (L) 22 - 32 mmol/L   Glucose, Bld 178 (H) 70 - 99 mg/dL    Comment: Glucose reference range applies only to samples taken after fasting for at least 8 hours.    BUN 14 8 - 23 mg/dL   Creatinine, Ser 7.25 (H) 0.61 - 1.24 mg/dL   Calcium 8.3 (L) 8.9 - 10.3 mg/dL   GFR, Estimated 37 (L) >60 mL/min    Comment: (NOTE) Calculated using the CKD-EPI Creatinine Equation (2021)    Anion gap 15 5 - 15    Comment: Performed at [facility], [address] [place], [place] [address]  CBC     Status: Abnormal   Collection Time: 05/24/2023  5:31 PM  Result Value Ref Range   WBC 11.8 (H) 4.0 - 10.5 K/uL   RBC 3.59 (L) 4.22 - 5.81 MIL/uL   Hemoglobin 10.4 (L) 13.0 - 17.0 g/dL   HCT 03.4 (L) 74.2 - 59.5 %   MCV 94.2 80.0 - 100.0 fL   MCH 29.0 26.0 - 34.0 pg   MCHC 30.8 30.0 - 36.0 g/dL   RDW 63.8 (H) 75.6 - 43.3 %   Platelets 172 150 - 400 K/uL   nRBC 0.0 0.0 - 0.2 %    Comment: Performed at [facility], [address], [place], [place] [address]  Troponin I (High Sensitivity)     Status: Abnormal   Collection Time: 05/21/2023  5:31 PM  Result Value Ref Range   Troponin I (High Sensitivity) 27 (H) <18 ng/L    Comment: (NOTE) Elevated high sensitivity troponin I (hsTnI) values and significant  changes across serial measurements may suggest ACS but many other  chronic and acute conditions are known to elevate hsTnI results.  Refer to the "Links" section for chest pain algorithms and additional  guidance. Performed at [facility], [address] [place], [place] [address]   Magnesium     Status: None   Collection Time: 05/25/2023  5:31 PM  Result Value Ref Range   Magnesium 1.7 1.7 - 2.4 mg/dL    Comment: Performed at [facility], [address] [place], [place] [address]  Urinalysis, Routine w reflex microscopic -Urine, Unspecified Source     Status: Abnormal   Collection Time: 05/31/2023  6:17 PM  Result Value Ref Range   Color, Urine YELLOW YELLOW   APPearance HAZY (A) CLEAR   Specific Gravity, Urine 1.012 1.005 - 1.030   pH 5.0 5.0 - 8.0   Glucose, UA 50 (A) NEGATIVE mg/dL   Hgb urine dipstick SMALL (A) NEGATIVE    Bilirubin Urine NEGATIVE NEGATIVE   Ketones, ur NEGATIVE NEGATIVE mg/dL   Protein, ur 601 (A) NEGATIVE mg/dL   Nitrite NEGATIVE NEGATIVE   Leukocytes,Ua NEGATIVE NEGATIVE  0.61 - 1.24 mg/dL   Glucose, Bld 284 (H) 70 - 99 mg/dL    Comment: Glucose reference range applies only to samples taken after fasting for at least 8 hours.   Calcium, Ion 1.14 (L) 1.15 - 1.40 mmol/L   TCO2 20 (L) 22 - 32 mmol/L   Hemoglobin 11.2 (L) 13.0 - 17.0 g/dL   HCT 13.2 (L) 44.0 - 10.2 %  Basic metabolic panel     Status: Abnormal   Collection Time: 06/11/2023  5:31 PM  Result Value Ref Range   Sodium 142 135 - 145 mmol/L   Potassium 4.0 3.5 - 5.1 mmol/L   Chloride 107 98 - 111 mmol/L   CO2 20 (L) 22 - 32 mmol/L   Glucose, Bld 178 (H) 70 - 99 mg/dL    Comment: Glucose reference range applies only to samples taken after fasting for at least 8 hours.    BUN 14 8 - 23 mg/dL   Creatinine, Ser 7.25 (H) 0.61 - 1.24 mg/dL   Calcium 8.3 (L) 8.9 - 10.3 mg/dL   GFR, Estimated 37 (L) >60 mL/min    Comment: (NOTE) Calculated using the CKD-EPI Creatinine Equation (2021)    Anion gap 15 5 - 15    Comment: Performed at [facility], [address] [place], [place] [address]  CBC     Status: Abnormal   Collection Time: 05/24/2023  5:31 PM  Result Value Ref Range   WBC 11.8 (H) 4.0 - 10.5 K/uL   RBC 3.59 (L) 4.22 - 5.81 MIL/uL   Hemoglobin 10.4 (L) 13.0 - 17.0 g/dL   HCT 03.4 (L) 74.2 - 59.5 %   MCV 94.2 80.0 - 100.0 fL   MCH 29.0 26.0 - 34.0 pg   MCHC 30.8 30.0 - 36.0 g/dL   RDW 63.8 (H) 75.6 - 43.3 %   Platelets 172 150 - 400 K/uL   nRBC 0.0 0.0 - 0.2 %    Comment: Performed at [facility], [address], [place], [place] [address]  Troponin I (High Sensitivity)     Status: Abnormal   Collection Time: 05/21/2023  5:31 PM  Result Value Ref Range   Troponin I (High Sensitivity) 27 (H) <18 ng/L    Comment: (NOTE) Elevated high sensitivity troponin I (hsTnI) values and significant  changes across serial measurements may suggest ACS but many other  chronic and acute conditions are known to elevate hsTnI results.  Refer to the "Links" section for chest pain algorithms and additional  guidance. Performed at [facility], [address] [place], [place] [address]   Magnesium     Status: None   Collection Time: 05/25/2023  5:31 PM  Result Value Ref Range   Magnesium 1.7 1.7 - 2.4 mg/dL    Comment: Performed at [facility], [address] [place], [place] [address]  Urinalysis, Routine w reflex microscopic -Urine, Unspecified Source     Status: Abnormal   Collection Time: 05/31/2023  6:17 PM  Result Value Ref Range   Color, Urine YELLOW YELLOW   APPearance HAZY (A) CLEAR   Specific Gravity, Urine 1.012 1.005 - 1.030   pH 5.0 5.0 - 8.0   Glucose, UA 50 (A) NEGATIVE mg/dL   Hgb urine dipstick SMALL (A) NEGATIVE    Bilirubin Urine NEGATIVE NEGATIVE   Ketones, ur NEGATIVE NEGATIVE mg/dL   Protein, ur 601 (A) NEGATIVE mg/dL   Nitrite NEGATIVE NEGATIVE   Leukocytes,Ua NEGATIVE NEGATIVE  On: 06/14/2023 19:05   DG Chest Port 1 View  Result Date: 06/15/2023 CLINICAL DATA:  Status post intubation. EXAM: PORTABLE CHEST 1 VIEW COMPARISON:  Earlier today, 5:38 p.m. FINDINGS: 5:43 p.m. Status post median sternotomy and CABG procedure. Stable cardiomediastinal contours. Endotracheal tube has been advanced and is now 3.6 cm above the carina. Lung volumes are low. Asymmetric increase interstitial markings noted within the left mid and left lower lung. Visualized osseous structures are unremarkable. IMPRESSION: 1. Endotracheal tube has been advanced and is now 3.6 cm above the carina. 2. Asymmetric increase interstitial markings within the left mid and left lower lung. Electronically Signed   By: Signa Kell M.D.   On: 05/30/2023 19:05    Pending Labs Unresulted Labs (From admission, onward)     Start     Ordered   06/02/23 0500  CBC  Tomorrow morning,   R        06/14/2023 2010   06/02/23 0500  Basic metabolic panel  Tomorrow morning,   R        06/18/2023 2010   06/02/23 0500  Magnesium  Tomorrow morning,   R        05/22/2023 2010   06/11/2023 2016  APTT  Once,   R        06/19/2023 2015   06/20/2023 2015  Protime-INR  Once,   R        06/07/2023 2015   05/24/2023 2013  Hepatic function panel  Add-on,   AD        06/06/2023 2012   05/30/2023 2013  Lactic acid, plasma  (Lactic Acid)  STAT Now then every 3 hours,   R (with STAT occurrences)      05/21/2023 2012   06/11/2023 2012  Hemoglobin A1c  Once,   R       Comments: To assess prior glycemic control    06/16/2023 2011   05/29/2023 1840  Blood gas, arterial  Once,   R         06/15/2023 1844            Vitals/Pain Today's Vitals   06/15/2023 1805 06/19/2023 1900 06/10/2023 1900 06/20/2023 1945  BP: (!) 144/72 (!) 152/71  (!) 164/68  Pulse: 86   (!) 109  Resp: 19 18  20   Temp:  (!) 96.7 F (35.9 C)  (!) 96.6 F (35.9 C)  SpO2: 100% 100%  100%  Weight:   111.6 kg   Height:   5\' 10"  (1.778 m)     Isolation Precautions No active isolations  Medications Medications  0.9 %  sodium chloride infusion (0 mLs Intravenous Stopped 06/05/2023 1811)  docusate (COLACE) 50 MG/5ML liquid 100 mg (has no administration in time range)  polyethylene glycol (MIRALAX / GLYCOLAX) packet 17 g (has no administration in time range)  famotidine (PEPCID) tablet 20 mg (has no administration in time range)  docusate (COLACE) 50 MG/5ML liquid 100 mg (has no administration in time range)  polyethylene glycol (MIRALAX / GLYCOLAX) packet 17 g (has no administration in time range)  fentaNYL (SUBLIMAZE) injection 25 mcg (has no administration in time range)  fentaNYL (SUBLIMAZE) injection 25-100 mcg (has no administration in time range)  insulin aspart (novoLOG) injection 0-9 Units (has no administration in time range)  magnesium sulfate IVPB 2 g 50 mL (has no administration in time range)  iohexol (OMNIPAQUE) 350 MG/ML injection 60 mL (60 mLs Intravenous Contrast Given 06/17/2023 1903)    Mobility walks  On: 06/14/2023 19:05   DG Chest Port 1 View  Result Date: 06/15/2023 CLINICAL DATA:  Status post intubation. EXAM: PORTABLE CHEST 1 VIEW COMPARISON:  Earlier today, 5:38 p.m. FINDINGS: 5:43 p.m. Status post median sternotomy and CABG procedure. Stable cardiomediastinal contours. Endotracheal tube has been advanced and is now 3.6 cm above the carina. Lung volumes are low. Asymmetric increase interstitial markings noted within the left mid and left lower lung. Visualized osseous structures are unremarkable. IMPRESSION: 1. Endotracheal tube has been advanced and is now 3.6 cm above the carina. 2. Asymmetric increase interstitial markings within the left mid and left lower lung. Electronically Signed   By: Signa Kell M.D.   On: 05/30/2023 19:05    Pending Labs Unresulted Labs (From admission, onward)     Start     Ordered   06/02/23 0500  CBC  Tomorrow morning,   R        06/14/2023 2010   06/02/23 0500  Basic metabolic panel  Tomorrow morning,   R        06/18/2023 2010   06/02/23 0500  Magnesium  Tomorrow morning,   R        05/22/2023 2010   06/11/2023 2016  APTT  Once,   R        06/19/2023 2015   06/20/2023 2015  Protime-INR  Once,   R        06/07/2023 2015   05/24/2023 2013  Hepatic function panel  Add-on,   AD        06/06/2023 2012   05/30/2023 2013  Lactic acid, plasma  (Lactic Acid)  STAT Now then every 3 hours,   R (with STAT occurrences)      05/21/2023 2012   06/11/2023 2012  Hemoglobin A1c  Once,   R       Comments: To assess prior glycemic control    06/16/2023 2011   05/29/2023 1840  Blood gas, arterial  Once,   R         06/15/2023 1844            Vitals/Pain Today's Vitals   06/15/2023 1805 06/19/2023 1900 06/10/2023 1900 06/20/2023 1945  BP: (!) 144/72 (!) 152/71  (!) 164/68  Pulse: 86   (!) 109  Resp: 19 18  20   Temp:  (!) 96.7 F (35.9 C)  (!) 96.6 F (35.9 C)  SpO2: 100% 100%  100%  Weight:   111.6 kg   Height:   5\' 10"  (1.778 m)     Isolation Precautions No active isolations  Medications Medications  0.9 %  sodium chloride infusion (0 mLs Intravenous Stopped 06/05/2023 1811)  docusate (COLACE) 50 MG/5ML liquid 100 mg (has no administration in time range)  polyethylene glycol (MIRALAX / GLYCOLAX) packet 17 g (has no administration in time range)  famotidine (PEPCID) tablet 20 mg (has no administration in time range)  docusate (COLACE) 50 MG/5ML liquid 100 mg (has no administration in time range)  polyethylene glycol (MIRALAX / GLYCOLAX) packet 17 g (has no administration in time range)  fentaNYL (SUBLIMAZE) injection 25 mcg (has no administration in time range)  fentaNYL (SUBLIMAZE) injection 25-100 mcg (has no administration in time range)  insulin aspart (novoLOG) injection 0-9 Units (has no administration in time range)  magnesium sulfate IVPB 2 g 50 mL (has no administration in time range)  iohexol (OMNIPAQUE) 350 MG/ML injection 60 mL (60 mLs Intravenous Contrast Given 06/17/2023 1903)    Mobility walks

## 2023-06-01 NOTE — Progress Notes (Signed)
ANTICOAGULATION CONSULT NOTE - Initial Consult  Pharmacy Consult for Heparin Indication: atrial fibrillation  No Known Allergies  Patient Measurements: Height: 5\' 10"  (177.8 cm) Weight: 111.6 kg (246 lb) IBW/kg (Calculated) : 73 Heparin Dosing Weight: 97.4 kg  Vital Signs: Temp: 96.6 F (35.9 C) (10/12 1945) BP: 164/68 (10/12 1945) Pulse Rate: 109 (10/12 1945)  Labs: Recent Labs    06/16/2023 1726 05/26/2023 1731 06/15/2023 1913 06/06/2023 1915  HGB 11.2* 10.4* 10.5*  --   HCT 33.0* 33.8* 31.0*  --   PLT  --  172  --   --   CREATININE 1.70* 1.81*  --   --   TROPONINIHS  --  27*  --  155*    Estimated Creatinine Clearance: 40 mL/min (A) (by C-G formula based on SCr of 1.81 mg/dL (H)).   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 Patient is presenting as a post-arrest. Heparin per pharmacy consult placed for atrial fibrillation.  Patient is reportedly on apixaban prior to arrival though medication history is not completed and fill history does not reveal anything useful. Last dose would be unknown. Will require aPTT monitoring due to likely falsely high anti-Xa level secondary to DOAC use.  Hgb 10.5; plt 172  Goal of Therapy:  Heparin level 0.3-0.7 units/ml aPTT 66-102 seconds Monitor platelets by anticoagulation  protocol: Yes   Plan:  No initial heparin bolus Will get stat HL and aPTT to assess baseline coags Start heparin infusion at 1400 units/hr Check aPTT & anti-Xa level in 8 hours and daily while on heparin Continue to monitor via aPTT until levels are correlated Continue to monitor H&H and platelets  Delmar Landau, PharmD, BCPS 06/03/2023 8:16 PM ED Clinical Pharmacist -  [phone]

## 2023-06-01 NOTE — ED Provider Notes (Signed)
the abdomen or pelvis. 7. 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 Recommend referral to or continued care with vascular specialist on a nonemergent, outpatient basis, if and when clinically appropriate. (Ref.: J Vasc Surg. 2018; 67:2-77 and J Am Coll Radiol 2013;10(10):789-794.) Aortic Atherosclerosis (ICD10-I70.0). Electronically Signed   By: Jearld Lesch M.D.   On: 05/28/2023 19:44   CT ABDOMEN PELVIS W CONTRAST  Result Date: 06/07/2023 CLINICAL DATA:  Cardiac arrest, history of colon cancer * Tracking Code: BO * EXAM: CT ANGIOGRAPHY CHEST WITH CONTRAST CT ABDOMEN PELVIS WITH CONTRAST TECHNIQUE: Multidetector CT imaging of the chest was performed using the standard protocol during bolus administration of intravenous contrast. Multiplanar CT image reconstructions and MIPs were obtained to evaluate the vascular anatomy. Multidetector CT imaging of the abdomen and pelvis was performed using the standard protocol following bolus administration of intravenous contrast. RADIATION DOSE  REDUCTION: This exam was performed according to the departmental dose-optimization program which includes automated exposure control, adjustment of the mA and/or kV according to patient size and/or use of iterative reconstruction technique. CONTRAST:  60mL OMNIPAQUE IOHEXOL 350 MG/ML SOLN COMPARISON:  CT chest angiogram, 04/18/2014 FINDINGS: CT CHEST ANGIOGRAM FINDINGS Cardiovascular: Satisfactory opacification of the pulmonary arteries to the segmental level. No evidence of pulmonary embolism. Cardiomegaly. Three-vessel coronary artery calcifications status post median sternotomy and CABG. Enlargement of the main pulmonary artery measuring up to 3.8 cm in caliber. No pericardial effusion. Aortic atherosclerosis. Mediastinum/Nodes: No enlarged mediastinal, hilar, or axillary lymph nodes. Thyroid gland, trachea, and esophagus demonstrate no significant findings. Lungs/Pleura: Endotracheal intubation, tube tip above the carina. Bibasilar atelectasis or consolidation with trace pleural effusions. Musculoskeletal: No chest wall abnormality. Nondisplaced fractures of the anterior ribs, presumably secondary to CPR (series 4, image 97) Review of the MIP images confirms the above findings. CT ABDOMEN PELVIS FINDINGS Hepatobiliary: Coarse, nodular cirrhotic morphology of the liver. No focal liver abnormality is seen. Status post cholecystectomy. No biliary dilatation. Pancreas: Unremarkable. No pancreatic ductal dilatation or surrounding inflammatory changes. Spleen: Normal in size without significant abnormality. Adrenals/Urinary Tract: Adrenal glands are unremarkable. Kidneys are normal, without renal calculi, solid lesion, or hydronephrosis. Foley catheter in the bladder. Stomach/Bowel: Stomach is within normal limits. Status post right hemicolectomy and reanastomosis. No evidence of bowel wall thickening, distention, or inflammatory changes. Vascular/Lymphatic: Aortic atherosclerosis. Focal, saccular aneurysm arising  from the right aspect of the infrarenal abdominal aorta, overall dimensions of the vessel at this level 3.6 x 2.1 cm, aneurysm sac dimensions 1.3 cm in depth, 2.1 cm in width (series 5, image 56, series 8, image 54). No enlarged abdominal or pelvic lymph nodes. Reproductive: No mass or other significant abnormality. Other: Small fat containing bilateral inguinal hernias. Large volume ascites throughout the abdomen and pelvis. Musculoskeletal: No acute or significant osseous findings. IMPRESSION: 1. Negative examination for pulmonary embolism. 2. Bibasilar atelectasis or consolidation with trace pleural effusions. 3. Cardiomegaly and coronary artery disease status post median sternotomy and CABG. 4. Enlargement of the main pulmonary artery, as can be seen in pulmonary arterial hypertension. 5. Cirrhosis and large volume ascites throughout the abdomen and pelvis. 6. Status post right hemicolectomy and reanastomosis. No evidence of lymphadenopathy or metastatic disease in the abdomen or pelvis. 7. 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     Nose: Nose normal.     Mouth/Throat:     Mouth: Mucous membranes are moist.  Eyes:     Comments: Pupils 1 to 2 mm bilateral and not reactive  Cardiovascular:     Rate and Rhythm: Rhythm irregular.  Pulmonary:     Comments: Supraglottic device in place with bagged respirations Abdominal:     Tenderness: There is no guarding or rebound.  Skin:    Findings: No rash.  Neurological:     Comments: No response to verbal or physical stimuli     ED Results / Procedures / Treatments   Labs (all labs ordered are listed, but only abnormal results are displayed) Labs Reviewed  BASIC METABOLIC PANEL - Abnormal; Notable for the following components:      Result Value   CO2 20 (*)     Glucose, Bld 178 (*)    Creatinine, Ser 1.81 (*)    Calcium 8.3 (*)    GFR, Estimated 37 (*)    All other components within normal limits  CBC - Abnormal; Notable for the following components:   WBC 11.8 (*)    RBC 3.59 (*)    Hemoglobin 10.4 (*)    HCT 33.8 (*)    RDW 16.0 (*)    All other components within normal limits  URINALYSIS, ROUTINE W REFLEX MICROSCOPIC - Abnormal; Notable for the following components:   APPearance HAZY (*)    Glucose, UA 50 (*)    Hgb urine dipstick SMALL (*)    Protein, ur 100 (*)    Bacteria, UA RARE (*)    All other components within normal limits  I-STAT CHEM 8, ED - Abnormal; Notable for the following components:   Creatinine, Ser 1.70 (*)    Glucose, Bld 170 (*)    Calcium, Ion 1.14 (*)    TCO2 20 (*)    Hemoglobin 11.2 (*)    HCT 33.0 (*)    All other components within normal limits  I-STAT ARTERIAL BLOOD GAS, ED - Abnormal; Notable for the following components:   pH, Arterial 7.316 (*)    pO2, Arterial 195 (*)    Bicarbonate 17.6 (*)    TCO2 19 (*)    Acid-base deficit 8.0 (*)    Calcium, Ion 1.12 (*)    HCT 31.0 (*)    Hemoglobin 10.5 (*)    All other components within normal limits  TROPONIN I (HIGH SENSITIVITY) - Abnormal; Notable for the following components:   Troponin I (High Sensitivity) 27 (*)    All other components within normal limits  TROPONIN I (HIGH SENSITIVITY) - Abnormal; Notable for the following components:   Troponin I (High Sensitivity) 155 (*)    All other components within normal limits  MAGNESIUM  BLOOD GAS, ARTERIAL  CBC  BASIC METABOLIC PANEL  MAGNESIUM  HEMOGLOBIN A1C  HEPATIC FUNCTION PANEL  LACTIC ACID, PLASMA  LACTIC ACID, PLASMA  PROTIME-INR  APTT  TROPONIN I (HIGH SENSITIVITY)    EKG None  Radiology CT Head Wo Contrast  Result Date: 06/10/2023 CLINICAL DATA:  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 EXAM: CT HEAD WITHOUT CONTRAST TECHNIQUE: Contiguous axial images were obtained  from the base of the skull through the vertex without intravenous contrast. RADIATION DOSE REDUCTION: This exam was performed according to the departmental dose-optimization program which includes automated exposure control, adjustment of the mA and/or kV according to patient size and/or use of iterative reconstruction technique.  the abdomen or pelvis. 7. 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 Recommend referral to or continued care with vascular specialist on a nonemergent, outpatient basis, if and when clinically appropriate. (Ref.: J Vasc Surg. 2018; 67:2-77 and J Am Coll Radiol 2013;10(10):789-794.) Aortic Atherosclerosis (ICD10-I70.0). Electronically Signed   By: Jearld Lesch M.D.   On: 05/28/2023 19:44   CT ABDOMEN PELVIS W CONTRAST  Result Date: 06/07/2023 CLINICAL DATA:  Cardiac arrest, history of colon cancer * Tracking Code: BO * EXAM: CT ANGIOGRAPHY CHEST WITH CONTRAST CT ABDOMEN PELVIS WITH CONTRAST TECHNIQUE: Multidetector CT imaging of the chest was performed using the standard protocol during bolus administration of intravenous contrast. Multiplanar CT image reconstructions and MIPs were obtained to evaluate the vascular anatomy. Multidetector CT imaging of the abdomen and pelvis was performed using the standard protocol following bolus administration of intravenous contrast. RADIATION DOSE  REDUCTION: This exam was performed according to the departmental dose-optimization program which includes automated exposure control, adjustment of the mA and/or kV according to patient size and/or use of iterative reconstruction technique. CONTRAST:  60mL OMNIPAQUE IOHEXOL 350 MG/ML SOLN COMPARISON:  CT chest angiogram, 04/18/2014 FINDINGS: CT CHEST ANGIOGRAM FINDINGS Cardiovascular: Satisfactory opacification of the pulmonary arteries to the segmental level. No evidence of pulmonary embolism. Cardiomegaly. Three-vessel coronary artery calcifications status post median sternotomy and CABG. Enlargement of the main pulmonary artery measuring up to 3.8 cm in caliber. No pericardial effusion. Aortic atherosclerosis. Mediastinum/Nodes: No enlarged mediastinal, hilar, or axillary lymph nodes. Thyroid gland, trachea, and esophagus demonstrate no significant findings. Lungs/Pleura: Endotracheal intubation, tube tip above the carina. Bibasilar atelectasis or consolidation with trace pleural effusions. Musculoskeletal: No chest wall abnormality. Nondisplaced fractures of the anterior ribs, presumably secondary to CPR (series 4, image 97) Review of the MIP images confirms the above findings. CT ABDOMEN PELVIS FINDINGS Hepatobiliary: Coarse, nodular cirrhotic morphology of the liver. No focal liver abnormality is seen. Status post cholecystectomy. No biliary dilatation. Pancreas: Unremarkable. No pancreatic ductal dilatation or surrounding inflammatory changes. Spleen: Normal in size without significant abnormality. Adrenals/Urinary Tract: Adrenal glands are unremarkable. Kidneys are normal, without renal calculi, solid lesion, or hydronephrosis. Foley catheter in the bladder. Stomach/Bowel: Stomach is within normal limits. Status post right hemicolectomy and reanastomosis. No evidence of bowel wall thickening, distention, or inflammatory changes. Vascular/Lymphatic: Aortic atherosclerosis. Focal, saccular aneurysm arising  from the right aspect of the infrarenal abdominal aorta, overall dimensions of the vessel at this level 3.6 x 2.1 cm, aneurysm sac dimensions 1.3 cm in depth, 2.1 cm in width (series 5, image 56, series 8, image 54). No enlarged abdominal or pelvic lymph nodes. Reproductive: No mass or other significant abnormality. Other: Small fat containing bilateral inguinal hernias. Large volume ascites throughout the abdomen and pelvis. Musculoskeletal: No acute or significant osseous findings. IMPRESSION: 1. Negative examination for pulmonary embolism. 2. Bibasilar atelectasis or consolidation with trace pleural effusions. 3. Cardiomegaly and coronary artery disease status post median sternotomy and CABG. 4. Enlargement of the main pulmonary artery, as can be seen in pulmonary arterial hypertension. 5. Cirrhosis and large volume ascites throughout the abdomen and pelvis. 6. Status post right hemicolectomy and reanastomosis. No evidence of lymphadenopathy or metastatic disease in the abdomen or pelvis. 7. 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 7. 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 Recommend referral to or continued care with vascular specialist on a nonemergent, outpatient basis, if and when clinically appropriate. (Ref.: J Vasc Surg. 2018; 67:2-77 and J Am Coll Radiol 2013;10(10):789-794.) Aortic Atherosclerosis (ICD10-I70.0). Electronically Signed   By: Jearld Lesch M.D.   On: 05/28/2023 19:44   CT ABDOMEN PELVIS W CONTRAST  Result Date: 06/07/2023 CLINICAL DATA:  Cardiac arrest, history of colon cancer * Tracking Code: BO * EXAM: CT ANGIOGRAPHY CHEST WITH CONTRAST CT ABDOMEN PELVIS WITH CONTRAST TECHNIQUE: Multidetector CT imaging of the chest was performed using the standard protocol during bolus administration of intravenous contrast. Multiplanar CT image reconstructions and MIPs were obtained to evaluate the vascular anatomy. Multidetector CT imaging of the abdomen and pelvis was performed using the standard protocol following bolus administration of intravenous contrast. RADIATION DOSE  REDUCTION: This exam was performed according to the departmental dose-optimization program which includes automated exposure control, adjustment of the mA and/or kV according to patient size and/or use of iterative reconstruction technique. CONTRAST:  60mL OMNIPAQUE IOHEXOL 350 MG/ML SOLN COMPARISON:  CT chest angiogram, 04/18/2014 FINDINGS: CT CHEST ANGIOGRAM FINDINGS Cardiovascular: Satisfactory opacification of the pulmonary arteries to the segmental level. No evidence of pulmonary embolism. Cardiomegaly. Three-vessel coronary artery calcifications status post median sternotomy and CABG. Enlargement of the main pulmonary artery measuring up to 3.8 cm in caliber. No pericardial effusion. Aortic atherosclerosis. Mediastinum/Nodes: No enlarged mediastinal, hilar, or axillary lymph nodes. Thyroid gland, trachea, and esophagus demonstrate no significant findings. Lungs/Pleura: Endotracheal intubation, tube tip above the carina. Bibasilar atelectasis or consolidation with trace pleural effusions. Musculoskeletal: No chest wall abnormality. Nondisplaced fractures of the anterior ribs, presumably secondary to CPR (series 4, image 97) Review of the MIP images confirms the above findings. CT ABDOMEN PELVIS FINDINGS Hepatobiliary: Coarse, nodular cirrhotic morphology of the liver. No focal liver abnormality is seen. Status post cholecystectomy. No biliary dilatation. Pancreas: Unremarkable. No pancreatic ductal dilatation or surrounding inflammatory changes. Spleen: Normal in size without significant abnormality. Adrenals/Urinary Tract: Adrenal glands are unremarkable. Kidneys are normal, without renal calculi, solid lesion, or hydronephrosis. Foley catheter in the bladder. Stomach/Bowel: Stomach is within normal limits. Status post right hemicolectomy and reanastomosis. No evidence of bowel wall thickening, distention, or inflammatory changes. Vascular/Lymphatic: Aortic atherosclerosis. Focal, saccular aneurysm arising  from the right aspect of the infrarenal abdominal aorta, overall dimensions of the vessel at this level 3.6 x 2.1 cm, aneurysm sac dimensions 1.3 cm in depth, 2.1 cm in width (series 5, image 56, series 8, image 54). No enlarged abdominal or pelvic lymph nodes. Reproductive: No mass or other significant abnormality. Other: Small fat containing bilateral inguinal hernias. Large volume ascites throughout the abdomen and pelvis. Musculoskeletal: No acute or significant osseous findings. IMPRESSION: 1. Negative examination for pulmonary embolism. 2. Bibasilar atelectasis or consolidation with trace pleural effusions. 3. Cardiomegaly and coronary artery disease status post median sternotomy and CABG. 4. Enlargement of the main pulmonary artery, as can be seen in pulmonary arterial hypertension. 5. Cirrhosis and large volume ascites throughout the abdomen and pelvis. 6. Status post right hemicolectomy and reanastomosis. No evidence of lymphadenopathy or metastatic disease in the abdomen or pelvis. 7. 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 7. 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 Recommend referral to or continued care with vascular specialist on a nonemergent, outpatient basis, if and when clinically appropriate. (Ref.: J Vasc Surg. 2018; 67:2-77 and J Am Coll Radiol 2013;10(10):789-794.) Aortic Atherosclerosis (ICD10-I70.0). Electronically Signed   By: Jearld Lesch M.D.   On: 05/28/2023 19:44   CT ABDOMEN PELVIS W CONTRAST  Result Date: 06/07/2023 CLINICAL DATA:  Cardiac arrest, history of colon cancer * Tracking Code: BO * EXAM: CT ANGIOGRAPHY CHEST WITH CONTRAST CT ABDOMEN PELVIS WITH CONTRAST TECHNIQUE: Multidetector CT imaging of the chest was performed using the standard protocol during bolus administration of intravenous contrast. Multiplanar CT image reconstructions and MIPs were obtained to evaluate the vascular anatomy. Multidetector CT imaging of the abdomen and pelvis was performed using the standard protocol following bolus administration of intravenous contrast. RADIATION DOSE  REDUCTION: This exam was performed according to the departmental dose-optimization program which includes automated exposure control, adjustment of the mA and/or kV according to patient size and/or use of iterative reconstruction technique. CONTRAST:  60mL OMNIPAQUE IOHEXOL 350 MG/ML SOLN COMPARISON:  CT chest angiogram, 04/18/2014 FINDINGS: CT CHEST ANGIOGRAM FINDINGS Cardiovascular: Satisfactory opacification of the pulmonary arteries to the segmental level. No evidence of pulmonary embolism. Cardiomegaly. Three-vessel coronary artery calcifications status post median sternotomy and CABG. Enlargement of the main pulmonary artery measuring up to 3.8 cm in caliber. No pericardial effusion. Aortic atherosclerosis. Mediastinum/Nodes: No enlarged mediastinal, hilar, or axillary lymph nodes. Thyroid gland, trachea, and esophagus demonstrate no significant findings. Lungs/Pleura: Endotracheal intubation, tube tip above the carina. Bibasilar atelectasis or consolidation with trace pleural effusions. Musculoskeletal: No chest wall abnormality. Nondisplaced fractures of the anterior ribs, presumably secondary to CPR (series 4, image 97) Review of the MIP images confirms the above findings. CT ABDOMEN PELVIS FINDINGS Hepatobiliary: Coarse, nodular cirrhotic morphology of the liver. No focal liver abnormality is seen. Status post cholecystectomy. No biliary dilatation. Pancreas: Unremarkable. No pancreatic ductal dilatation or surrounding inflammatory changes. Spleen: Normal in size without significant abnormality. Adrenals/Urinary Tract: Adrenal glands are unremarkable. Kidneys are normal, without renal calculi, solid lesion, or hydronephrosis. Foley catheter in the bladder. Stomach/Bowel: Stomach is within normal limits. Status post right hemicolectomy and reanastomosis. No evidence of bowel wall thickening, distention, or inflammatory changes. Vascular/Lymphatic: Aortic atherosclerosis. Focal, saccular aneurysm arising  from the right aspect of the infrarenal abdominal aorta, overall dimensions of the vessel at this level 3.6 x 2.1 cm, aneurysm sac dimensions 1.3 cm in depth, 2.1 cm in width (series 5, image 56, series 8, image 54). No enlarged abdominal or pelvic lymph nodes. Reproductive: No mass or other significant abnormality. Other: Small fat containing bilateral inguinal hernias. Large volume ascites throughout the abdomen and pelvis. Musculoskeletal: No acute or significant osseous findings. IMPRESSION: 1. Negative examination for pulmonary embolism. 2. Bibasilar atelectasis or consolidation with trace pleural effusions. 3. Cardiomegaly and coronary artery disease status post median sternotomy and CABG. 4. Enlargement of the main pulmonary artery, as can be seen in pulmonary arterial hypertension. 5. Cirrhosis and large volume ascites throughout the abdomen and pelvis. 6. Status post right hemicolectomy and reanastomosis. No evidence of lymphadenopathy or metastatic disease in the abdomen or pelvis. 7. 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     Nose: Nose normal.     Mouth/Throat:     Mouth: Mucous membranes are moist.  Eyes:     Comments: Pupils 1 to 2 mm bilateral and not reactive  Cardiovascular:     Rate and Rhythm: Rhythm irregular.  Pulmonary:     Comments: Supraglottic device in place with bagged respirations Abdominal:     Tenderness: There is no guarding or rebound.  Skin:    Findings: No rash.  Neurological:     Comments: No response to verbal or physical stimuli     ED Results / Procedures / Treatments   Labs (all labs ordered are listed, but only abnormal results are displayed) Labs Reviewed  BASIC METABOLIC PANEL - Abnormal; Notable for the following components:      Result Value   CO2 20 (*)     Glucose, Bld 178 (*)    Creatinine, Ser 1.81 (*)    Calcium 8.3 (*)    GFR, Estimated 37 (*)    All other components within normal limits  CBC - Abnormal; Notable for the following components:   WBC 11.8 (*)    RBC 3.59 (*)    Hemoglobin 10.4 (*)    HCT 33.8 (*)    RDW 16.0 (*)    All other components within normal limits  URINALYSIS, ROUTINE W REFLEX MICROSCOPIC - Abnormal; Notable for the following components:   APPearance HAZY (*)    Glucose, UA 50 (*)    Hgb urine dipstick SMALL (*)    Protein, ur 100 (*)    Bacteria, UA RARE (*)    All other components within normal limits  I-STAT CHEM 8, ED - Abnormal; Notable for the following components:   Creatinine, Ser 1.70 (*)    Glucose, Bld 170 (*)    Calcium, Ion 1.14 (*)    TCO2 20 (*)    Hemoglobin 11.2 (*)    HCT 33.0 (*)    All other components within normal limits  I-STAT ARTERIAL BLOOD GAS, ED - Abnormal; Notable for the following components:   pH, Arterial 7.316 (*)    pO2, Arterial 195 (*)    Bicarbonate 17.6 (*)    TCO2 19 (*)    Acid-base deficit 8.0 (*)    Calcium, Ion 1.12 (*)    HCT 31.0 (*)    Hemoglobin 10.5 (*)    All other components within normal limits  TROPONIN I (HIGH SENSITIVITY) - Abnormal; Notable for the following components:   Troponin I (High Sensitivity) 27 (*)    All other components within normal limits  TROPONIN I (HIGH SENSITIVITY) - Abnormal; Notable for the following components:   Troponin I (High Sensitivity) 155 (*)    All other components within normal limits  MAGNESIUM  BLOOD GAS, ARTERIAL  CBC  BASIC METABOLIC PANEL  MAGNESIUM  HEMOGLOBIN A1C  HEPATIC FUNCTION PANEL  LACTIC ACID, PLASMA  LACTIC ACID, PLASMA  PROTIME-INR  APTT  TROPONIN I (HIGH SENSITIVITY)    EKG None  Radiology CT Head Wo Contrast  Result Date: 06/10/2023 CLINICAL DATA:  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 EXAM: CT HEAD WITHOUT CONTRAST TECHNIQUE: Contiguous axial images were obtained  from the base of the skull through the vertex without intravenous contrast. RADIATION DOSE REDUCTION: This exam was performed according to the departmental dose-optimization program which includes automated exposure control, adjustment of the mA and/or kV according to patient size and/or use of iterative reconstruction technique.

## 2023-06-01 NOTE — ED Notes (Signed)
Patient transported to CT with RN and RT

## 2023-06-01 NOTE — ED Notes (Signed)
Portable XR at bedside

## 2023-06-01 NOTE — H&P (Addendum)
NAME:  Dwayne Brown, MRN:  [id number], DOB:  1940-12-10, LOS: 0 ADMISSION DATE:  05/25/2023, CONSULTATION DATE:  06/02/2023 REFERRING MD: Lyman Speller, MD , CHIEF COMPLAINT: cardiac arrest   History of Present Illness:  An 82 year old male with CAD (CABG x5, 2002), Afib on NOAC, CKD-3a, HTN, dyslipidemia, IDDM, anemia of chronic illness, and obesity (BMI 35), who presented to ED with EMS as postarrest. Patient was a witnessed arrest at home by his wife. He fell at 4 pm and she started chest compression immediately till EMS arrived after 10 min. Between bystander CPR and EMS CPR, total estimated downtime was 10 minutes. EMS reported that there was a PEA rhythm, not shockable. They administered 2 rounds of epinephrine and did get ROSC. EMS team then started pacing as he had an intrinsic rhythm in the 50s (slow Afib). Intubated with ETT 7.5 @ 27 cm at the teeth on PRVC 18/580/+5/60%. On Epi drip 5 mcg/min and fentanyl drip 50 mcg/hr.  Quit smoking 40 yrs ago. No alcohol or illicit drug use. EKG showed RBBB and Afib without RVR, no ischemic changes. Wife denied that he had f/c/r, CP, SOB, cough, wheezing, URT symptoms, palpitations, dizziness, N/V/D, abd pain, rash, or recent symptoms.     Pertinent  Medical History  CAD (CABG x5, 2002), Afib on NOAC, CKD-3a, HTN, dyslipidemia, IDDM, anemia of chronic illness, obesity (BMI 35)  Significant Hospital Events: Including procedures, antibiotic start and stop dates in addition to other pertinent events   Intubated   Interim History / Subjective:    Objective   Blood pressure (!) 164/68, pulse (!) 109, temperature (!) 96.6 F (35.9 C), resp. rate 20, height 5\' 10"  (1.778 m), weight 111.6 kg, SpO2 100%.    Vent Mode: PRVC FiO2 (%):  [60 %-100 %] 60 % Set Rate:  [18 bmp] 18 bmp Vt Set:  [580 mL] 580 mL PEEP:  [5 cmH20] 5 cmH20 Plateau Pressure:  [22 cmH20-23 cmH20] 23 cmH20   Intake/Output Summary (Last 24 hours) at 06/03/2023 2031 Last data  filed at 05/22/2023 1818 Gross per 24 hour  Intake 1018.84 ml  Output --  Net 1018.84 ml   Filed Weights   06/06/2023 1900  Weight: 111.6 kg    Examination: General: elderly man, sedated, not in distress.   HENT: Pupils are fixed small, not reactive to light. No LNE or thyromegaly. No JVD Lungs: symmetrical air entry bilaterally, with diffuse crackles. No wheezing Cardiovascular: Afib, controlled rate. NL S1/S2. No m/g/r Abdomen: no distension or tenderness Extremities: right >left, +2 on the right and +1 on the left (chronic per his wife). Good peripheral pulses.   Neuro: sedated  Resolved Hospital Problem list    Assessment & Plan:  PEA cardiac arrest with slightly elevated trop that could be due to the cardiac arrest or NSTEMI Acute hypoxic resp failure due to pulm edema and cardiac arrest AKI/CKD-3a CAD (CABG x5, 2002) Afib on NOAC HTN Dyslipidemia IDDM Anemia of chronic illness Obesity (BMI 35) HypoMg Liver cirrhosis and ascites Possible aspiration PNA  PLAN: Admit to ICU Serial Trop IV heparin drip ASA Echo Replace Mg Statin if LFT are NL Consider BB if trop trends up  LFT, HbA1c, TFT Hepatic function test and hepatitis panel IV Rocephin for possible aspiration  Duoneb PRN ETT Cx and Bcx2 if he develops fever or leukocytosis ABG: PRVC 20/+480/+5/60% D/c sedation EEG Monitor labs ISS Glycemic control GI/DVT prophylaxis   Best Practice (right click and "Reselect all SmartList Selections" daily)  Diet/type: NPO DVT prophylaxis: systemic heparin GI prophylaxis: H2B Lines: N/A Foley:  Yes, and it is still needed Code Status:  full code Last date of multidisciplinary goals of care discussion []   Labs   CBC: Recent Labs  Lab 05/29/2023 1726 05/24/2023 1731 06/03/2023 1913  WBC  --  11.8*  --   HGB 11.2* 10.4* 10.5*  HCT 33.0* 33.8* 31.0*  MCV  --  94.2  --   PLT  --  172  --     Basic Metabolic Panel: Recent Labs  Lab 05/21/2023 1726  06/09/2023 1731 05/24/2023 1913  NA 140 142 138  K 4.0 4.0 3.5  CL 106 107  --   CO2  --  20*  --   GLUCOSE 170* 178*  --   BUN 15 14  --   CREATININE 1.70* 1.81*  --   CALCIUM  --  8.3*  --   MG  --  1.7  --    GFR: Estimated Creatinine Clearance: 40 mL/min (A) (by C-G formula based on SCr of 1.81 mg/dL (H)). Recent Labs  Lab 06/05/2023 1731  WBC 11.8*    Liver Function Tests: No results for input(s): "AST", "ALT", "ALKPHOS", "BILITOT", "PROT", "ALBUMIN" in the last 168 hours. No results for input(s): "LIPASE", "AMYLASE" in the last 168 hours. No results for input(s): "AMMONIA" in the last 168 hours.  ABG    Component Value Date/Time   PHART 7.316 (L) 05/23/2023 1913   PCO2ART 34.1 06/02/2023 1913   PO2ART 195 (H) 05/27/2023 1913   HCO3 17.6 (L) 05/23/2023 1913   TCO2 19 (L) 05/30/2023 1913   ACIDBASEDEF 8.0 (H) 06/07/2023 1913   O2SAT 100 06/06/2023 1913     Coagulation Profile: No results for input(s): "INR", "PROTIME" in the last 168 hours.  Cardiac Enzymes: No results for input(s): "CKTOTAL", "CKMB", "CKMBINDEX", "TROPONINI" in the last 168 hours.  HbA1C: No results found for: "HGBA1C"  CBG: No results for input(s): "GLUCAP" in the last 168 hours.  Review of Systems:   Intubated   Past Medical History:  He,  has a past medical history of CAD (coronary artery disease) of bypass graft, Cancer (HCC), Diabetes mellitus without complication (HCC), Essential hypertension, and High cholesterol.   Surgical History:   Past Surgical History:  Procedure Laterality Date   CHOLECYSTECTOMY     COLON SURGERY     mass removed from colon that tested positive for cancer   CORONARY ARTERY BYPASS GRAFT     VASECTOMY       Social History:   reports that he has never smoked. He does not have any smokeless tobacco history on file. He reports current alcohol use. He reports that he does not use drugs.   Family History:  His family history is not on file.    Allergies No Known Allergies   Home Medications  Prior to Admission medications   Medication Sig Start Date End Date Taking? Authorizing Provider  allopurinol (ZYLOPRIM) 300 MG tablet Take 300 mg by mouth daily.   Yes [provider]  apixaban (ELIQUIS) 5 MG TABS tablet Take 2.5 mg by mouth 2 (two) times daily.   Yes [provider]  finasteride (PROSCAR) 5 MG tablet Take 5 mg by mouth daily.   Yes [provider]  gabapentin (NEURONTIN) 300 MG capsule Take 1 capsule (300 mg total) by mouth 3 (three) times daily. Patient taking differently: Take 300 mg by mouth 2 (two) times daily. 10/03/19  Yes Pfeiffer,  Lebron Conners, MD  glipiZIDE (GLUCOTROL) 5 MG tablet Take 2.5 mg by mouth daily.   Yes [provider]  insulin glargine-yfgn (SEMGLEE, YFGN,) 100 UNIT/ML Pen Inject 57 Units into the skin at bedtime.   Yes [provider]  ketoconazole (NIZORAL) 2 % shampoo Apply 1 Application topically daily as needed for irritation.   Yes [provider]  loperamide (IMODIUM) 2 MG capsule Take 2 mg by mouth 2 (two) times daily.   Yes [provider]  metoprolol succinate (TOPROL-XL) 100 MG 24 hr tablet Take 50 mg by mouth 2 (two) times daily. Take with or immediately following a meal.   Yes [provider]  nitroGLYCERIN (NITROSTAT) 0.4 MG SL tablet Place 0.4 mg under the tongue every 5 (five) minutes x 3 doses as needed for chest pain.   Yes [provider]  pantoprazole (PROTONIX) 40 MG tablet Take 40 mg by mouth daily before breakfast.   Yes [provider]  Polyvinyl Alcohol-Povidone (REFRESH OP) Place 1 drop into both eyes daily as needed (dry eyes).   Yes [provider]  simvastatin (ZOCOR) 20 MG tablet Take 20 mg by mouth at bedtime.    Yes [provider]  tamsulosin (FLOMAX) 0.4 MG CAPS capsule Take 0.4 mg by mouth at bedtime.   Yes [provider]  triamcinolone (KENALOG) 0.025 % cream  Apply 1 Application topically 2 (two) times daily as needed (atopic dermatitis).   Yes [provider]     Critical care time: 60 min

## 2023-06-01 NOTE — Progress Notes (Signed)
eLink Physician-Brief Progress Note Patient Name: Dwayne Brown DOB: 03-Apr-1941 MRN: [id number]   Date of Service  06/12/2023  HPI/Events of Note  82 year old male with extensive cardiac history who presented to the ED via EMS postarrest.  Bystander CPR was started immediately and EMS took over when they got there.  Total estimated downtime was 10 minutes.  He is in ICU intubated and on no sedation.  He does not follow any commands.  eICU Interventions  Patient's chart reviewed.  Pertinent labs and imaging studies reviewed.  Video assessment of patient done.  He is very dyssynchronous with the vent with extremely labored breathing.  He appears uncomfortable. Impression: Postcardiac arrest And anoxic encephalopathy Acute respiratory failure Vent dyssynchrony Lactic acidosis Acute kidney injury  Plan: Start patient on some sedation for vent synchrony. Initiate TTM with a goal temperature of 36 C. Give IV fluids to help clear lactate and improve kidney function Neuro prognostication can be done when TTM is over. Plan of care discussed with bedside nurse.     Intervention Category Evaluation Type: New Patient Evaluation  Carilyn Goodpasture 06/12/2023, 10:15 PM  Addendum: Work of breathing greatly improved on sedation.  Patient has however had to be started on norepinephrine for hypotension related to sedation.  Noted.  Carilyn Goodpasture 06/08/2023, 12:59 AM

## 2023-06-01 NOTE — Progress Notes (Signed)
PT transported via ventilator from [facility] to 2H16 w/o complications RT and RN at bedside.

## 2023-06-01 NOTE — ED Notes (Signed)
Unsuccessful venipuncture, unable to collect blood cultures.  Notified RN

## 2023-06-01 NOTE — Progress Notes (Signed)
EEG attempted, pt being moved. Reatempting in new room as schedule allows.

## 2023-06-01 NOTE — ED Triage Notes (Signed)
Patient arrives [facility] post-arrest after a witnessed cardiac arrest at home with family. Patient received 10 min total of CPR by family, [employer], and [employer]. No shockable rhythm, given 2 epi, epi drip started prior to arrival at 61mcg/min. Rhythm was PEA with rate of 20/min. Patient arrives to ED being demand paced. Intubated in field oxygen saturation 90-94. Lines placed en route, 18 right AC, 20 left wrist. IO placed en route. Last BP with EMS 119/66. Patient noted to be moving neck, not biting tube.

## 2023-06-01 NOTE — Progress Notes (Signed)
Pt transported on vent from [facility] to CT and back without any complications.RN at bedside, RT will monitor.

## 2023-06-02 ENCOUNTER — Inpatient Hospital Stay (HOSPITAL_COMMUNITY): Payer: Non-veteran care

## 2023-06-02 DIAGNOSIS — I469 Cardiac arrest, cause unspecified: Secondary | ICD-10-CM | POA: Diagnosis not present

## 2023-06-02 DIAGNOSIS — R569 Unspecified convulsions: Secondary | ICD-10-CM

## 2023-06-02 LAB — HEPARIN LEVEL (UNFRACTIONATED): Heparin Unfractionated: >1.1 [IU]/mL — ABNORMAL HIGH (ref 0.30–0.70)

## 2023-06-02 LAB — COMPREHENSIVE METABOLIC PANEL
ALT: 15 U/L (ref 0–44)
AST: 28 U/L (ref 15–41)
Albumin: 2.9 g/dL — ABNORMAL LOW (ref 3.5–5.0)
Alkaline Phosphatase: 111 U/L (ref 38–126)
Anion gap: 13 (ref 5–15)
BUN: 16 mg/dL (ref 8–23)
CO2: 20 mmol/L — ABNORMAL LOW (ref 22–32)
Calcium: 8.2 mg/dL — ABNORMAL LOW (ref 8.9–10.3)
Chloride: 105 mmol/L (ref 98–111)
Creatinine, Ser: 1.6 mg/dL — ABNORMAL HIGH (ref 0.61–1.24)
GFR, Estimated: 43 mL/min — ABNORMAL LOW (ref >60)
Glucose, Bld: 163 mg/dL — ABNORMAL HIGH (ref 70–99)
Potassium: 5.1 mmol/L (ref 3.5–5.1)
Sodium: 138 mmol/L (ref 135–145)
Total Bilirubin: 0.8 mg/dL (ref 0.3–1.2)
Total Protein: 5.3 g/dL — ABNORMAL LOW (ref 6.5–8.1)

## 2023-06-02 LAB — PHOSPHORUS: Phosphorus: 4 mg/dL (ref 2.5–4.6)

## 2023-06-02 LAB — GLUCOSE, CAPILLARY
Glucose-Capillary: 171 mg/dL — ABNORMAL HIGH (ref 70–99)
Glucose-Capillary: 162 mg/dL — ABNORMAL HIGH (ref 70–99)
Glucose-Capillary: 103 mg/dL — ABNORMAL HIGH (ref 70–99)
Glucose-Capillary: 110 mg/dL — ABNORMAL HIGH (ref 70–99)
Glucose-Capillary: 113 mg/dL — ABNORMAL HIGH (ref 70–99)

## 2023-06-02 LAB — POCT I-STAT 7, (LYTES, BLD GAS, ICA,H+H)
Acid-base deficit: 6 mmol/L — ABNORMAL HIGH (ref 0.0–2.0)
Bicarbonate: 19.7 mmol/L — ABNORMAL LOW (ref 20.0–28.0)
Calcium, Ion: 1.14 mmol/L — ABNORMAL LOW (ref 1.15–1.40)
HCT: 32 % — ABNORMAL LOW (ref 39.0–52.0)
Hemoglobin: 10.9 g/dL — ABNORMAL LOW (ref 13.0–17.0)
O2 Saturation: 99 %
Patient temperature: 37.2
Potassium: 4.8 mmol/L (ref 3.5–5.1)
Sodium: 137 mmol/L (ref 135–145)
TCO2: 21 mmol/L — ABNORMAL LOW (ref 22–32)
pCO2 arterial: 38.7 mm[Hg] (ref 32–48)
pH, Arterial: 7.314 — ABNORMAL LOW (ref 7.35–7.45)
pO2, Arterial: 154 mm[Hg] — ABNORMAL HIGH (ref 83–108)

## 2023-06-02 LAB — ECHOCARDIOGRAM COMPLETE
AR max vel: 1.45 cm2
AV Area VTI: 1.67 cm2
AV Area mean vel: 1.44 cm2
AV Mean grad: 10 mm[Hg]
AV Peak grad: 19.5 mm[Hg]
Ao pk vel: 2.21 m/s
Area-P 1/2: 3.74 cm2
Height: 70 in
S' Lateral: 2.3 cm
Weight: 4162.28 [oz_av]

## 2023-06-02 LAB — LACTIC ACID, PLASMA
Lactic Acid, Venous: 5.8 mmol/L (ref 0.5–1.9)
Lactic Acid, Venous: 2.7 mmol/L (ref 0.5–1.9)
Lactic Acid, Venous: 2.9 mmol/L (ref 0.5–1.9)

## 2023-06-02 LAB — CBC
HCT: 33.6 % — ABNORMAL LOW (ref 39.0–52.0)
Hemoglobin: 10.8 g/dL — ABNORMAL LOW (ref 13.0–17.0)
MCH: 29.8 pg (ref 26.0–34.0)
MCHC: 32.1 g/dL (ref 30.0–36.0)
MCV: 92.8 fL (ref 80.0–100.0)
Platelets: 156 10*3/uL (ref 150–400)
RBC: 3.62 MIL/uL — ABNORMAL LOW (ref 4.22–5.81)
RDW: 16.2 % — ABNORMAL HIGH (ref 11.5–15.5)
WBC: 15.1 10*3/uL — ABNORMAL HIGH (ref 4.0–10.5)
nRBC: 0 % (ref 0.0–0.2)

## 2023-06-02 LAB — TRIGLYCERIDES: Triglycerides: 135 mg/dL (ref <150)

## 2023-06-02 LAB — TROPONIN I (HIGH SENSITIVITY)
Troponin I (High Sensitivity): 154 ng/L (ref <18)
Troponin I (High Sensitivity): 145 ng/L (ref <18)

## 2023-06-02 LAB — MRSA NEXT GEN BY PCR, NASAL: MRSA by PCR Next Gen: NOT DETECTED

## 2023-06-02 LAB — APTT: aPTT: 105 s — ABNORMAL HIGH (ref 24–36)

## 2023-06-02 LAB — MAGNESIUM
Magnesium: 1.9 mg/dL (ref 1.7–2.4)
Magnesium: 1.7 mg/dL (ref 1.7–2.4)

## 2023-06-02 MED ORDER — PERFLUTREN LIPID MICROSPHERE
1.0000 mL | INTRAVENOUS | Status: AC | PRN
  Administered 2023-06-02: 2 mL via INTRAVENOUS

## 2023-06-02 MED ORDER — PROSOURCE TF20 ENFIT COMPATIBL EN LIQD
60.0000 mL | Freq: Every day | ENTERAL | Status: DC
  Administered 2023-06-02 – 2023-06-04 (×2): 60 mL
  Filled 2023-06-02 (×3): qty 60

## 2023-06-02 MED ORDER — ALLOPURINOL 300 MG PO TABS
300.0000 mg | ORAL_TABLET | Freq: Every day | ORAL | Status: DC
  Administered 2023-06-02 – 2023-06-04 (×3): 300 mg via ORAL
  Filled 2023-06-02 (×3): qty 1

## 2023-06-02 MED ORDER — CHLORHEXIDINE GLUCONATE CLOTH 2 % EX PADS
6.0000 | MEDICATED_PAD | Freq: Every day | CUTANEOUS | Status: DC
  Administered 2023-06-02 – 2023-06-04 (×3): 6 via TOPICAL

## 2023-06-02 MED ORDER — PIVOT 1.5 CAL PO LIQD
1000.0000 mL | ORAL | Status: DC
  Administered 2023-06-02: 1000 mL

## 2023-06-02 MED ORDER — POLYVINYL ALCOHOL 1.4 % OP SOLN
1.0000 [drp] | Freq: Four times a day (QID) | OPHTHALMIC | Status: DC
  Administered 2023-06-02 – 2023-06-04 (×9): 1 [drp] via OPHTHALMIC
  Filled 2023-06-02: qty 15

## 2023-06-02 MED ORDER — SIMVASTATIN 20 MG PO TABS
20.0000 mg | ORAL_TABLET | Freq: Every day | ORAL | Status: DC
  Administered 2023-06-03 (×2): 20 mg via ORAL
  Filled 2023-06-02 (×2): qty 1

## 2023-06-02 NOTE — Progress Notes (Signed)
ANTICOAGULATION CONSULT NOTE - Consult  Pharmacy Consult for Heparin Indication: atrial fibrillation  No Known Allergies  Patient Measurements: Height: 5\' 10"  (177.8 cm) Weight: 118 kg (260 lb 2.3 oz) IBW/kg (Calculated) : 73 Heparin Dosing Weight: 97.4 kg  Vital Signs: Temp: 98.2 F (36.8 C) (10/13 1215) Temp Source: Esophageal (10/13 1200) BP: 136/60 (10/13 1215) Pulse Rate: 107 (10/13 1215)  Labs: Recent Labs    2023/06/15 1726 2023-06-15 1726 Jun 15, 2023 1731 06-15-2023 1913 06-15-2023 1915 Jun 15, 2023 2032 06-15-2023 2221 06/02/23 0305 06/02/23 0618  HGB 11.2*  --  10.4* 10.5*  --   --   --  10.9* 10.8*  HCT 33.0*  --  33.8* 31.0*  --   --   --  32.0* 33.6*  PLT  --   --  172  --   --   --   --   --  156  APTT  --   --   --   --  28  --   --   --  105*  LABPROT  --   --   --   --  16.0*  --   --   --   --   INR  --   --   --   --  1.3*  --   --   --   --   HEPARINUNFRC  --   --   --   --   --  >1.10*  --   --  >1.10*  CREATININE 1.70*  --  1.81*  --   --   --   --   --  1.60*  TROPONINIHS  --    < > 27*  --  155* 206* 282*  --   --    < > = values in this interval not displayed.    Estimated Creatinine Clearance: 46.6 mL/min (A) (by C-G formula based on SCr of 1.6 mg/dL (H)).   Medical History: Past Medical History:  Diagnosis Date   CAD (coronary artery disease) of bypass graft    Cancer (HCC)    Diabetes mellitus without complication (HCC)    Essential hypertension    High cholesterol     Medications:  Medications Prior to Admission  Medication Sig Dispense Refill Last Dose   allopurinol (ZYLOPRIM) 300 MG tablet Take 300 mg by mouth daily.   Jun 15, 2023   apixaban (ELIQUIS) 5 MG TABS tablet Take 2.5 mg by mouth 2 (two) times daily.   06/15/23 at 0900   finasteride (PROSCAR) 5 MG tablet Take 5 mg by mouth daily.   06-15-23   gabapentin (NEURONTIN) 300 MG capsule Take 1 capsule (300 mg total) by mouth 3 (three) times daily. (Patient taking differently: Take 300  mg by mouth 2 (two) times daily.) 21 capsule 0 06/15/2023   glipiZIDE (GLUCOTROL) 5 MG tablet Take 2.5 mg by mouth daily.   06/15/23   insulin glargine-yfgn (SEMGLEE, YFGN,) 100 UNIT/ML Pen Inject 57 Units into the skin at bedtime.   05/31/2023   ketoconazole (NIZORAL) 2 % shampoo Apply 1 Application topically daily as needed for irritation.   UNK   loperamide (IMODIUM) 2 MG capsule Take 2 mg by mouth 2 (two) times daily.   UNK   metoprolol succinate (TOPROL-XL) 100 MG 24 hr tablet Take 50 mg by mouth 2 (two) times daily. Take with or immediately following a meal.   15-Jun-2023   nitroGLYCERIN (NITROSTAT) 0.4 MG SL tablet Place 0.4 mg under the tongue every 5 (  five) minutes x 3 doses as needed for chest pain.   NEVER   pantoprazole (PROTONIX) 40 MG tablet Take 40 mg by mouth daily before breakfast.   2023-06-16   Polyvinyl Alcohol-Povidone (REFRESH OP) Place 1 drop into both eyes daily as needed (dry eyes).   UNK   simvastatin (ZOCOR) 20 MG tablet Take 20 mg by mouth at bedtime.    05/31/2023   tamsulosin (FLOMAX) 0.4 MG CAPS capsule Take 0.4 mg by mouth at bedtime.   05/31/2023   triamcinolone (KENALOG) 0.025 % cream Apply 1 Application topically 2 (two) times daily as needed (atopic dermatitis).   UNK   Scheduled:   acetaminophen  650 mg Oral Q4H   Or   acetaminophen (TYLENOL) oral liquid 160 mg/5 mL  650 mg Per Tube Q4H   Or   acetaminophen  650 mg Rectal Q4H   aspirin  81 mg Per Tube Daily   Chlorhexidine Gluconate Cloth  6 each Topical Q0600   docusate  100 mg Per Tube BID   famotidine  20 mg Per Tube BID   insulin aspart  0-9 Units Subcutaneous Q4H   mouth rinse  15 mL Mouth Rinse Q2H   polyethylene glycol  17 g Per Tube Daily   Infusions:   sodium chloride Stopped (06/02/23 0428)   cefTRIAXone (ROCEPHIN)  IV Stopped (06-16-2023 2213)   fentaNYL infusion INTRAVENOUS 150 mcg/hr (06/02/23 1200)   heparin 1,400 Units/hr (06/02/23 1200)   magnesium sulfate     norepinephrine  (LEVOPHED) Adult infusion 6 mcg/min (06/02/23 1200)   propofol (DIPRIVAN) infusion 10 mcg/kg/min (06/02/23 1200)   PRN: [START ON 06/03/2023] acetaminophen **OR** [START ON 06/03/2023] acetaminophen (TYLENOL) oral liquid 160 mg/5 mL **OR** [START ON 06/03/2023] acetaminophen, busPIRone **OR** busPIRone, docusate, fentaNYL, fentaNYL (SUBLIMAZE) injection, fentaNYL (SUBLIMAZE) injection, magnesium sulfate, ondansetron (ZOFRAN) IV, mouth rinse, polyethylene glycol  Assessment: 82 yom with a history of DM, HTN, HLD, CAD, CKD. Patient is presenting as a post-arrest. Heparin per pharmacy consult placed for atrial fibrillation.  Patient is reportedly on apixaban prior to arrival though medication history is not completed but Xa level elevated as would be in pta apixaban use Will use aptt to dose heparin  Heparin drip 1400 uts/hr with aptt 105sec at top of goal range, no bleeding noted, cbc stable    Goal of Therapy:  Heparin level 0.3-0.7 units/ml aPTT 66-102 seconds Monitor platelets by anticoagulation protocol: Yes   Plan:  Decrease heparin infusion 1300 units/hr Continue to monitor via aPTT, cbc daily  Continue to monitor s/s bleeding     Leota Sauers Pharm.D. CPP, BCPS Clinical Pharmacist [phone] 06/02/2023 3:24 PM

## 2023-06-02 NOTE — Procedures (Signed)
Bronchoscopy Procedure Note  Dwayne Brown  [id number]  18-Apr-1941  Date:06/02/23  Time:6:58 PM   Provider Performing:Raylea Adcox   Procedure(s):  Flexible Bronchoscopy (40981)  Indication(s) Intermittent airway obstruction with desaturation.  Consent Unable to obtain consent due to emergent nature of procedure.  Anesthesia Patient already on continuous sedation.   Time Out Verified patient identification, verified procedure, site/side was marked, verified correct patient position, special equipment/implants available, medications/allergies/relevant history reviewed, required imaging and test results available.   Sterile Technique Usual hand hygiene, masks, gowns, and gloves were used   Procedure Description Bronchoscope advanced through endotracheal tube and into airway.  Airways were examined down to subsegmental level with findings noted below.   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  Tip of the endotracheal tube was abutting the anterior tracheal wall.  The tube was retracted 2 cm to clear the obstruction.  Patient has enlarged pulmonary artery on CT scan which is likely cause of this deformity.   Complications/Tolerance None; patient tolerated the procedure well. Chest X-ray is not needed post procedure.  Lynnell Catalan, MD [facility] ICU Physician [facility]  Pager: [phone] Or Epic Secure Chat After hours: [phone].  06/02/2023, 7:05 PM

## 2023-06-02 NOTE — Procedures (Signed)
Patient Name: Dwayne Brown  MRN: [id number]  Epilepsy Attending: Charlsie Quest  Referring Physician/Provider: Lidia Collum, PA-C  Date: 06/02/2023 Duration: 24.08 mins  Patient history: 82yo M s/p cardiac arrest getting eeg to evaluate for seizure.  Level of alertness: comatose  AEDs during EEG study: Propofol  Technical aspects: This EEG study was done with scalp electrodes positioned according to the 10-20 International system of electrode placement. Electrical activity was reviewed with band pass filter of 1-70Hz , sensitivity of 7 uV/mm, display speed of 68mm/sec with a 60Hz  notched filter applied as appropriate. EEG data were recorded continuously and digitally stored.  Video monitoring was available and reviewed as appropriate.  Description: EEG showed continuous generalized background suppression, not reactive to stimulation. Hyperventilation and photic stimulation were not performed.     ABNORMALITY - Background suppression, generalized  IMPRESSION: This study is suggestive of profound diffuse encephalopathy. No seizures or epileptiform discharges were seen throughout the recording.  [name]

## 2023-06-02 NOTE — Procedures (Signed)
Bedside Bronchoscopy Procedure Note Dwayne Brown [id number] 08-10-1941  Procedure: Bronchoscopy Indications: Diagnostic evaluation of the airways  Procedure Details: ET Tube Size: ET Tube secured at lip (cm): Bite block in place: Yes In preparation for procedure, Patient hyper-oxygenated with   % FiO2 Airway entered and the following bronchi were examined: RUL, RML, RLL, LUL, LLL, and Bronchi.   Bronchoscope removed.    Evaluation BP (!) 128/59   Pulse (!) 105   Temp 98.1 F (36.7 C)   Resp (!) 22   Ht 5\' 10"  (1.778 m)   Wt 118 kg   SpO2 99%   BMI 37.33 kg/m  Breath Sounds:Rhonch O2 sats: stable throughout Patient's Current Condition: stable Specimens:  None Complications: No apparent complications Patient did tolerate procedure well.   Fritzie Prioleau 06/02/2023, 6:50 PM

## 2023-06-02 NOTE — Progress Notes (Signed)
NAME:  Dwayne Brown, MRN:  [id number], DOB:  1941/06/24, LOS: 1 ADMISSION DATE:  06/17/2023, CONSULTATION DATE:  05/28/2023 REFERRING MD: Lyman Speller, MD , CHIEF COMPLAINT: cardiac arrest   History of Present Illness:  An 82 year old male with CAD (CABG x5, 2002), Afib on NOAC, CKD-3a, HTN, dyslipidemia, IDDM, anemia of chronic illness, and obesity (BMI 35), who presented to ED with EMS as postarrest. Patient was a witnessed arrest at home by his wife. He fell at 4 pm and she started chest compression immediately till EMS arrived after 10 min. Between bystander CPR and EMS CPR, total estimated downtime was 10 minutes. EMS reported that there was a PEA rhythm, not shockable. They administered 2 rounds of epinephrine and did get ROSC. EMS team then started pacing as he had an intrinsic rhythm in the 50s (slow Afib). Intubated with ETT 7.5 @ 27 cm at the teeth on PRVC 18/580/+5/60%. On Epi drip 5 mcg/min and fentanyl drip 50 mcg/hr.  Quit smoking 40 yrs ago. No alcohol or illicit drug use. EKG showed RBBB and Afib without RVR, no ischemic changes. Wife denied that he had f/c/r, CP, SOB, cough, wheezing, URT symptoms, palpitations, dizziness, N/V/D, abd pain, rash, or recent symptoms.     Pertinent  Medical History  CAD (CABG x5, 2002), Afib on NOAC, CKD-3a, HTN, dyslipidemia, IDDM, anemia of chronic illness, obesity (BMI 35)  Significant Hospital Events: Including procedures, antibiotic start and stop dates in addition to other pertinent events   10/12 - intubated.   Interim History / Subjective:   Remains sedated due to severe coughing.  Will flex but not follow commands when stimulated prior to sedation.  Objective   Blood pressure 119/62, pulse 83, temperature 98.1 F (36.7 C), resp. rate (!) 21, height 5\' 10"  (1.778 m), weight 118 kg, SpO2 98%.    Vent Mode: PCV FiO2 (%):  [40 %-100 %] 40 % Set Rate:  [18 bmp-20 bmp] 20 bmp Vt Set:  [480 mL-580 mL] 580 mL PEEP:  [5 cmH20] 5  cmH20 Plateau Pressure:  [22 cmH20-23 cmH20] 22 cmH20   Intake/Output Summary (Last 24 hours) at 06/02/2023 1109 Last data filed at 06/02/2023 0900 Gross per 24 hour  Intake 4205.56 ml  Output 1440 ml  Net 2765.56 ml   Filed Weights   06/15/2023 1900 05/30/2023 2125 06/02/23 0445  Weight: 111.6 kg 113.9 kg 118 kg    Examination: General: elderly man, sedated, HENT: Pupils are fixed small, not reactive to light. No LNE or thyromegaly. No JVD Lungs: symmetrical air entry bilaterally, with diffuse crackles. No wheezing Cardiovascular: Afib, controlled rate. NL S1/S2. No m/g/r.  Extremities are warm Abdomen: no distension or tenderness Extremities: right >left, +2 on the right and +1 on the left (chronic per his wife). Good peripheral pulses.   Neuro: sedated.  No response to painful stimulation.  Ancillary tests personally reviewed:  ABG acceptable. EKG shows no ischemic changes. CT PE shows no clot. Abdominal CT confirms cirrhosis with ascites. Creatinine 1.6, lactate 5.8 Mildly elevated troponin. Assessment & Plan:   PEA cardiac arrest of unclear etiology but likely of cardiac origin given prior ischemic substrate At high risk for hypoxic ischemic encephalopathy given prolonged cardiac arrest and advanced age Acute hypoxic respiratory failure due to possible aspiration event. 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  Ensure lactate clearance. -Serial troponin, follow-up echocardiogram, rule out ACS/worsening cardiomyopathy. -Full ventilatory support. -Targeted temperature management to normothermia.  Continue IV sedation to facilitate TTM tolerance. -Neuro  prognostication at 96 hours with off sedation exam and brain MRI. -Presently does not appear to be in decompensated cirrhosis.  Will check ammonia. -Phase 1 glycemic control. -In rate controlled atrial fibrillation.  Continue heparin for stroke prevention.  Best Practice (right click and "Reselect all SmartList Selections" daily)   Diet/type: Initiate tube feeds DVT prophylaxis: systemic heparin for stroke prevention GI prophylaxis: H2B Lines: N/A Foley:  Yes, and it is still needed Code Status:  full code Last date of multidisciplinary goals of care discussion [family updated at the bedside, they are aware that prognosis may not be favorable..]  CRITICAL CARE Performed by: Lynnell Catalan   Total critical care time: 40 minutes  Critical care time was exclusive of separately billable procedures and treating other patients.  Critical care was necessary to treat or prevent imminent or life-threatening deterioration.  Critical care was time spent personally by me on the following activities: development of treatment plan with patient and/or surrogate as well as nursing, discussions with consultants, evaluation of patient's response to treatment, examination of patient, obtaining history from patient or surrogate, ordering and performing treatments and interventions, ordering and review of laboratory studies, ordering and review of radiographic studies, pulse oximetry, re-evaluation of patient's condition and participation in multidisciplinary rounds.  Lynnell Catalan, MD [facility] ICU Physician [facility] Critical Care  Pager: [phone] Mobile: [phone] After hours: [phone].

## 2023-06-02 NOTE — Progress Notes (Signed)
EEG complete - results pending 

## 2023-06-02 NOTE — Progress Notes (Signed)
RT called to bedside by RN. RN stated that pts SpO2 decreased to 70's, pts MVe decreased on ventilator and pts face turned blue. RT placed bite block due to pt biting down on ETT. RT placed pt back on PRVC and notified CCM.

## 2023-06-02 NOTE — Progress Notes (Signed)
Critical care attending.  Call to bedside for intermittent desaturation with high airway pressures.  The endotracheal tube was found to be abutting the anterior wall of the trachea.  Tube retracted 2 cm to clear the anterior wall.  Dumbbell shaped deformity with anterior wall invaginating the anterior portion of the tracheal lumen.  This deformity is pulsatile.  Likely represents pulsatile pulmonary artery abutting the carina as enlarged pulmonary artery demonstrated on CT scan.  Echocardiogram performed but not read.  Lynnell Catalan, MD [facility] ICU Physician [facility] [place] Critical Care  Pager: [phone] Or Epic Secure Chat After hours: [phone].  06/02/2023, 7:08 PM

## 2023-06-03 ENCOUNTER — Inpatient Hospital Stay (HOSPITAL_COMMUNITY): Payer: Non-veteran care

## 2023-06-03 DIAGNOSIS — I469 Cardiac arrest, cause unspecified: Secondary | ICD-10-CM | POA: Diagnosis not present

## 2023-06-03 DIAGNOSIS — J690 Pneumonitis due to inhalation of food and vomit: Secondary | ICD-10-CM

## 2023-06-03 DIAGNOSIS — Z9911 Dependence on respirator [ventilator] status: Secondary | ICD-10-CM | POA: Diagnosis not present

## 2023-06-03 DIAGNOSIS — R739 Hyperglycemia, unspecified: Secondary | ICD-10-CM | POA: Diagnosis not present

## 2023-06-03 LAB — CBC WITH DIFFERENTIAL/PLATELET
Abs Immature Granulocytes: 0.09 10*3/uL — ABNORMAL HIGH (ref 0.00–0.07)
Basophils Absolute: 0 10*3/uL (ref 0.0–0.1)
Basophils Relative: 0 %
Eosinophils Absolute: 0.1 10*3/uL (ref 0.0–0.5)
Eosinophils Relative: 1 %
HCT: 32.8 % — ABNORMAL LOW (ref 39.0–52.0)
Hemoglobin: 10.2 g/dL — ABNORMAL LOW (ref 13.0–17.0)
Immature Granulocytes: 1 %
Lymphocytes Relative: 5 %
Lymphs Abs: 0.6 10*3/uL — ABNORMAL LOW (ref 0.7–4.0)
MCH: 28.9 pg (ref 26.0–34.0)
MCHC: 31.1 g/dL (ref 30.0–36.0)
MCV: 92.9 fL (ref 80.0–100.0)
Monocytes Absolute: 0.8 10*3/uL (ref 0.1–1.0)
Monocytes Relative: 7 %
Neutro Abs: 11 10*3/uL — ABNORMAL HIGH (ref 1.7–7.7)
Neutrophils Relative %: 86 %
Platelets: 138 10*3/uL — ABNORMAL LOW (ref 150–400)
RBC: 3.53 MIL/uL — ABNORMAL LOW (ref 4.22–5.81)
RDW: 16.5 % — ABNORMAL HIGH (ref 11.5–15.5)
WBC: 12.6 10*3/uL — ABNORMAL HIGH (ref 4.0–10.5)
nRBC: 0 % (ref 0.0–0.2)

## 2023-06-03 LAB — GLUCOSE, CAPILLARY
Glucose-Capillary: 149 mg/dL — ABNORMAL HIGH (ref 70–99)
Glucose-Capillary: 135 mg/dL — ABNORMAL HIGH (ref 70–99)
Glucose-Capillary: 174 mg/dL — ABNORMAL HIGH (ref 70–99)
Glucose-Capillary: 160 mg/dL — ABNORMAL HIGH (ref 70–99)
Glucose-Capillary: 147 mg/dL — ABNORMAL HIGH (ref 70–99)
Glucose-Capillary: 151 mg/dL — ABNORMAL HIGH (ref 70–99)

## 2023-06-03 LAB — COMPREHENSIVE METABOLIC PANEL
ALT: 14 U/L (ref 0–44)
AST: 26 U/L (ref 15–41)
Albumin: 2.9 g/dL — ABNORMAL LOW (ref 3.5–5.0)
Alkaline Phosphatase: 102 U/L (ref 38–126)
Anion gap: 16 — ABNORMAL HIGH (ref 5–15)
BUN: 20 mg/dL (ref 8–23)
CO2: 17 mmol/L — ABNORMAL LOW (ref 22–32)
Calcium: 8.4 mg/dL — ABNORMAL LOW (ref 8.9–10.3)
Chloride: 104 mmol/L (ref 98–111)
Creatinine, Ser: 1.86 mg/dL — ABNORMAL HIGH (ref 0.61–1.24)
GFR, Estimated: 36 mL/min — ABNORMAL LOW (ref >60)
Glucose, Bld: 138 mg/dL — ABNORMAL HIGH (ref 70–99)
Potassium: 5.1 mmol/L (ref 3.5–5.1)
Sodium: 137 mmol/L (ref 135–145)
Total Bilirubin: 0.8 mg/dL (ref 0.3–1.2)
Total Protein: 5.4 g/dL — ABNORMAL LOW (ref 6.5–8.1)

## 2023-06-03 LAB — APTT: aPTT: 100 s — ABNORMAL HIGH (ref 24–36)

## 2023-06-03 LAB — HEPARIN LEVEL (UNFRACTIONATED): Heparin Unfractionated: >1.1 [IU]/mL — ABNORMAL HIGH (ref 0.30–0.70)

## 2023-06-03 LAB — PHOSPHORUS
Phosphorus: 4.2 mg/dL (ref 2.5–4.6)
Phosphorus: 3.3 mg/dL (ref 2.5–4.6)

## 2023-06-03 LAB — MAGNESIUM
Magnesium: 1.7 mg/dL (ref 1.7–2.4)
Magnesium: 1.9 mg/dL (ref 1.7–2.4)

## 2023-06-03 MED ORDER — SODIUM CHLORIDE 0.9 % IV BOLUS
500.0000 mL | Freq: Once | INTRAVENOUS | Status: AC
  Administered 2023-06-03: 500 mL via INTRAVENOUS

## 2023-06-03 MED ORDER — SODIUM CHLORIDE 0.9 % IV BOLUS: 500.0000 mL | Freq: Once | INTRAVENOUS | Status: DC

## 2023-06-03 MED ORDER — VITAL 1.5 CAL PO LIQD
1000.0000 mL | ORAL | Status: DC
  Administered 2023-06-03 – 2023-06-04 (×2): 1000 mL

## 2023-06-03 NOTE — Progress Notes (Signed)
ANTICOAGULATION CONSULT NOTE  Pharmacy Consult for Heparin Indication: atrial fibrillation  No Known Allergies  Patient Measurements: Height: 5\' 10"  (177.8 cm) Weight: 114.1 kg (251 lb 8.7 oz) IBW/kg (Calculated) : 73 Heparin Dosing Weight: 97.4 kg  Vital Signs: Temp: 98.8 F (37.1 C) (10/14 1330) BP: 129/58 (10/14 1330) Pulse Rate: 95 (10/14 1330)  Labs: Recent Labs    06/18/2023 1731 06/18/2023 1913 06/12/2023 1915 06/16/2023 2032 05/24/2023 2221 06/02/23 0305 06/02/23 0618 06/02/23 1738 06/02/23 2027 06/03/23 0516  HGB 10.4*   < >  --   --   --  10.9* 10.8*  --   --  10.2*  HCT 33.8*   < >  --   --   --  32.0* 33.6*  --   --  32.8*  PLT 172  --   --   --   --   --  156  --   --  138*  APTT  --   --  28  --   --   --  105*  --   --  100*  LABPROT  --   --  16.0*  --   --   --   --   --   --   --   INR  --   --  1.3*  --   --   --   --   --   --   --   HEPARINUNFRC  --   --   --  >1.10*  --   --  >1.10*  --   --  >1.10*  CREATININE 1.81*  --   --   --   --   --  1.60*  --   --  1.86*  TROPONINIHS 27*  --  155* 206* 282*  --   --  154* 145*  --    < > = values in this interval not displayed.    Estimated Creatinine Clearance: 39.4 mL/min (A) (by C-G formula based on SCr of 1.86 mg/dL (H)).   Medical History: Past Medical History:  Diagnosis Date   CAD (coronary artery disease) of bypass graft    Cancer (HCC)    Diabetes mellitus without complication (HCC)    Essential hypertension    High cholesterol     Medications:  Medications Prior to Admission  Medication Sig Dispense Refill Last Dose   allopurinol (ZYLOPRIM) 300 MG tablet Take 300 mg by mouth daily.   05/28/2023   apixaban (ELIQUIS) 5 MG TABS tablet Take 2.5 mg by mouth 2 (two) times daily.   05/24/2023 at 0900   finasteride (PROSCAR) 5 MG tablet Take 5 mg by mouth daily.   06/14/2023   gabapentin (NEURONTIN) 300 MG capsule Take 1 capsule (300 mg total) by mouth 3 (three) times daily. (Patient taking  differently: Take 300 mg by mouth 2 (two) times daily.) 21 capsule 0 06/05/2023   glipiZIDE (GLUCOTROL) 5 MG tablet Take 2.5 mg by mouth daily.   06/05/2023   insulin glargine-yfgn (SEMGLEE, YFGN,) 100 UNIT/ML Pen Inject 57 Units into the skin at bedtime.   05/31/2023   ketoconazole (NIZORAL) 2 % shampoo Apply 1 Application topically daily as needed for irritation.   UNK   loperamide (IMODIUM) 2 MG capsule Take 2 mg by mouth 2 (two) times daily.   UNK   metoprolol succinate (TOPROL-XL) 100 MG 24 hr tablet Take 50 mg by mouth 2 (two) times daily. Take with or immediately following a meal.  06/18/2023   nitroGLYCERIN (NITROSTAT) 0.4 MG SL tablet Place 0.4 mg under the tongue every 5 (five) minutes x 3 doses as needed for chest pain.   NEVER   pantoprazole (PROTONIX) 40 MG tablet Take 40 mg by mouth daily before breakfast.   06/15/2023   Polyvinyl Alcohol-Povidone (REFRESH OP) Place 1 drop into both eyes daily as needed (dry eyes).   UNK   simvastatin (ZOCOR) 20 MG tablet Take 20 mg by mouth at bedtime.    05/31/2023   tamsulosin (FLOMAX) 0.4 MG CAPS capsule Take 0.4 mg by mouth at bedtime.   05/31/2023   triamcinolone (KENALOG) 0.025 % cream Apply 1 Application topically 2 (two) times daily as needed (atopic dermatitis).   UNK   Scheduled:   acetaminophen  650 mg Oral Q4H   Or   acetaminophen (TYLENOL) oral liquid 160 mg/5 mL  650 mg Per Tube Q4H   Or   acetaminophen  650 mg Rectal Q4H   allopurinol  300 mg Oral Daily   aspirin  81 mg Per Tube Daily   Chlorhexidine Gluconate Cloth  6 each Topical Q0600   docusate  100 mg Per Tube BID   famotidine  20 mg Per Tube BID   feeding supplement (PIVOT 1.5 CAL)  1,000 mL Per Tube Q24H   feeding supplement (PROSource TF20)  60 mL Per Tube Daily   insulin aspart  0-9 Units Subcutaneous Q4H   mouth rinse  15 mL Mouth Rinse Q2H   polyethylene glycol  17 g Per Tube Daily   polyvinyl alcohol  1 drop Both Eyes Q6H   simvastatin  20 mg Oral QHS    Infusions:   cefTRIAXone (ROCEPHIN)  IV Stopped (06/02/23 2106)   fentaNYL infusion INTRAVENOUS 125 mcg/hr (06/03/23 1300)   heparin 1,300 Units/hr (06/03/23 1300)   norepinephrine (LEVOPHED) Adult infusion 5 mcg/min (06/03/23 1300)   propofol (DIPRIVAN) infusion 20 mcg/kg/min (06/03/23 1300)   PRN: acetaminophen **OR** acetaminophen (TYLENOL) oral liquid 160 mg/5 mL **OR** acetaminophen, docusate, fentaNYL, fentaNYL (SUBLIMAZE) injection, fentaNYL (SUBLIMAZE) injection, ondansetron (ZOFRAN) IV, mouth rinse, polyethylene glycol  Assessment: 82 yom with a history of DM, HTN, HLD, CAD, CKD. Patient is presenting as a post-arrest. Heparin per pharmacy consult placed for atrial fibrillation.  Patient is reportedly on apixaban prior to arrival though medication history is not completed but Xa level elevated as would be in pta apixaban use  Heparin level continues to be falsely elevated as expected with recent DOAC.  APTT at top end of goal range this morning.  No overt bleeding or complications noted.  Goal of Therapy:  Heparin level 0.3-0.7 units/ml aPTT 66-102 seconds Monitor platelets by anticoagulation protocol: Yes   Plan:  Decrease heparin infusion slightly to 1250 units/hr Continue to monitor via aPTT, cbc daily  Continue to monitor s/s bleeding   Reece Leader, Colon Flattery, [facility] Clinical Pharmacist  06/03/2023 2:41 PM   [facility] pharmacy phone numbers are listed on [email or web address]

## 2023-06-03 NOTE — Progress Notes (Signed)
Initial Nutrition Assessment  DOCUMENTATION CODES:   Not applicable  INTERVENTION:   Recommend obtaining abd xray to confirm that OG has been advanced into stomach. Recommend holding TF initiation until correct placement confirmed  Tube Feeding via OG: Vital 1.5 at 20 ml/hr (trickle today) Goal TF regimen: Vital 1.5 at 50 ml/hr with Pro-Source TF20 60 mL daily TF at goal provides 101 g of protein, 1880 kcals and 912 mL of free water  NUTRITION DIAGNOSIS:   Inadequate oral intake related to acute illness as evidenced by NPO status.  GOAL:   Patient will meet greater than or equal to 90% of their needs   MONITOR:   Vent status, TF tolerance, Labs, Weight trends  REASON FOR ASSESSMENT:   Consult, Ventilator Enteral/tube feeding initiation and management  ASSESSMENT:   82 yo male admitted post PEA arrest requiring intubation. PMH includes CAD/CABG x 5 (2002), CKD 3a, HTN, IDDM, dyslipidemia. Noted hx of surgery for removal of colonic mass-positive for cancer  10/12 Intubated, chest xray indicating OG tip in distal esophagus 10/13 TF ordered per protocol  but not initiated due to vomiting  Pt remains on vent support Propofol: 13.3 ml/hr (351 kcals at current rate over 24 hours)  TF ordered yesterday but not started due to emesis.   CT abdomen with large volume ascites throughout the abdomen. Pt with cirrhosis. No ileus or obstruction  Chest xray from 10/12 indicating OG tube tip in distal esophagus; not repeat films on 10/13 or today to confirm advancement with correct tube placement. Recommend confirming via xray  Labs: CBGs 110-174, sodium 137 (wdl), potassium 5.1 (wdl), Creatinine 1.86, BUN wdl, phosphorus 4.2 (wdl), magnesium 1.7 (wdl) Meds: colace, miralax, ss novolog   Diet Order:   Diet Order             Diet NPO time specified Except for: Other (See Comments)  Diet effective now                   EDUCATION NEEDS:   Not appropriate for education  at this time  Skin:  Skin Assessment: Reviewed RN Assessment  Last BM:  10/12, BS present, abdomen distended but large volume ascites on CT abdomen  Height:   Ht Readings from Last 1 Encounters:  06/07/2023 5\' 10"  (1.778 m)    Weight:   Wt Readings from Last 1 Encounters:  06/03/23 114.1 kg     BMI:  Body mass index is 36.09 kg/m.  Estimated Nutritional Needs:   Kcal:  1750-1950 kcals  Protein:  90-110 g  Fluid:  >/= 1.8 L    Romelle Starcher MS, RDN, LDN, CNSC Registered Dietitian 3 Clinical Nutrition RD Pager and On-Call Pager Number Located in [place]

## 2023-06-03 NOTE — Progress Notes (Signed)
NAME:  Dwayne Brown, MRN:  [id number], DOB:  1941-07-02, LOS: 2 ADMISSION DATE:  2023/06/20, CONSULTATION DATE:  June 20, 2023 REFERRING MD: Lyman Speller, MD , CHIEF COMPLAINT: cardiac arrest   History of Present Illness:  An 82 year old male with CAD (CABG x5, 2002), Afib on NOAC, CKD-3a, HTN, dyslipidemia, IDDM, anemia of chronic illness, and obesity (BMI 35), who presented to ED with EMS as postarrest. Patient was a witnessed arrest at home by his wife. He fell at 4 pm and she started chest compression immediately till EMS arrived after 10 min. Between bystander CPR and EMS CPR, total estimated downtime was 10 minutes. EMS reported that there was a PEA rhythm, not shockable. They administered 2 rounds of epinephrine and did get ROSC. EMS team then started pacing as he had an intrinsic rhythm in the 50s (slow Afib). Intubated with ETT 7.5 @ 27 cm at the teeth on PRVC 18/580/+5/60%. On Epi drip 5 mcg/min and fentanyl drip 50 mcg/hr.  Quit smoking 40 yrs ago. No alcohol or illicit drug use. EKG showed RBBB and Afib without RVR, no ischemic changes. Wife denied that he had f/c/r, CP, SOB, cough, wheezing, URT symptoms, palpitations, dizziness, N/V/D, abd pain, rash, or recent symptoms.     Pertinent  Medical History  CAD (CABG x5, 2002), Afib on NOAC, CKD-3a, HTN, dyslipidemia, IDDM, anemia of chronic illness, obesity (BMI 35)  Significant Hospital Events: Including procedures, antibiotic start and stop dates in addition to other pertinent events   2023/06/20 - intubated.  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 Tip of the endotracheal tube was abutting the anterior tracheal wall. The tube was retracted 2 cm to clear the obstruction. Patient has enlarged pulmonary artery on CT scan which is likely cause of this deformity.   Interim History / Subjective:   Remains sedated due to severe  coughing.  Will flex but not follow commands when stimulated prior to sedation.  Remains in A fib with constant rate variability, which is making it difficult to titrate his levophed,  Currently on Levo at 4  Initiated TF 10/13>> Pt vomited so stopped.   Will try trickle feeds again 10/14  Objective   Blood pressure (!) 129/58, pulse 95, temperature 98.8 F (37.1 C), resp. rate 20, height 5\' 10"  (1.778 m), weight 114.1 kg, SpO2 97%.    Vent Mode: PRVC FiO2 (%):  [40 %] 40 % Set Rate:  [20 bmp] 20 bmp Vt Set:  [580 mL] 580 mL PEEP:  [5 cmH20] 5 cmH20 Plateau Pressure:  [24 cmH20-28 cmH20] 24 cmH20   Intake/Output Summary (Last 24 hours) at 06/03/2023 1427 Last data filed at 06/03/2023 1300 Gross per 24 hour  Intake 3163.59 ml  Output 1870 ml  Net 1293.59 ml   Filed Weights   2023/06/20 2125 06/02/23 0445 06/03/23 0500  Weight: 113.9 kg 118 kg 114.1 kg    Examination: General: elderly man, sedated,intubated HENT: Pupils are  small, minimally reactive to light . No LNE or thyromegaly. No JVD Lungs: bilateral chest excursion, some  crackles. No wheezing, slightly diminished per bases Cardiovascular: Afib, variable rate . NL S1/S2. No m/g/r.  Extremities are warm Abdomen: no distension or tenderness Extremities: right >left, +2 on the right and +1 on the left (chronic per his wife). Good peripheral pulses.   Neuro: sedated.  No response to painful stimulation. No corneal's , weaning sedation for neuro prognostication Fentanyl first  Ancillary tests personally reviewed:  Labs reviewed 06/03/2023 >>  PTT 100/ Mag 1.7/ Phos 4.2/Na 137/ K 5.1/ Cl 104/CO2 17/ BUN 20/ Creatinine 1.86/ Calcium 8.4/ Total Protein 5.4/ Albumin 2.9/ AST 26. ALT 14/   WBC 12.6/ HGB 10.2/ Platelets 138                                                                                                                                                                                                                                                                                                    EKG shows no ischemic changes. CT PE shows no clot. Abdominal CT confirms cirrhosis with ascites.,  Mildly elevated troponin. Assessment & Plan:   PEA cardiac arrest of unclear etiology but likely of cardiac origin given prior ischemic substrate At high risk for hypoxic ischemic encephalopathy given prolonged cardiac arrest and advanced age Acute hypoxic respiratory failure due to possible aspiration event. 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  Ensure lactate clearance. --Full ventilatory support for now . -Targeted temperature management to normothermia stopped 10/13 at 2315.   -Wean sedation , fentanyl first to enable neuro prognostication .  -Neuro prognostication at 96 hours ( 10/16/at 1700) with off sedation exam and brain MRI. -Presently does not appear to be in decompensated cirrhosis.  Will check ammonia. -Phase 1 glycemic control. -In rate controlled atrial fibrillation.  Continue heparin for stroke prevention. Will start Trickle feeds 10/14 , monitoring for emesis  Best Practice (right click and "Reselect all SmartList Selections" daily)   Diet/type: Initiate tube feeds at trickle DVT prophylaxis: systemic heparin for stroke prevention GI prophylaxis: H2B Lines: N/A Foley:  Yes, and it is still needed Code Status:  full code Last date of multidisciplinary goals of care discussion [family updated at the bedside, they are aware that prognosis may not  be favorable..]  CRITICAL CARE Performed by: Bevelyn Ngo   Total critical care time: 35 minutes  Critical care time was  exclusive of separately billable procedures and treating other patients.  Critical care was necessary to treat or prevent imminent or life-threatening deterioration.  Critical care was time spent personally by me on the following activities: development of treatment plan with patient and/or surrogate as well as nursing, discussions with consultants, evaluation of patient's response to treatment, examination of patient, obtaining history from patient or surrogate, ordering and performing treatments and interventions, ordering and review of laboratory studies, ordering and review of radiographic studies, pulse oximetry, re-evaluation of patient's condition and participation in multidisciplinary rounds.  Bevelyn Ngo, MSN, AGACNP-BC  Pulmonary/Critical Care Medicine See Amion for personal pager PCCM on call pager [phone]  06/03/2023 4:39 PM

## 2023-06-03 NOTE — TOC CM/SW Note (Signed)
Transition of Care [facility]) - Inpatient Brief Assessment   Patient Details  Name: Dwayne Brown MRN: [id number] Date of Birth: 22-Jan-1941  Transition of Care [facility]) CM/SW Contact:    Gala Lewandowsky, RN Phone Number: 06/03/2023, 4:27 PM   Clinical Narrative: Patient presented post cardiac arrest. PTA patient was from home with spouse. PCP is at the [facility]. Notification of hospitalization was submitted to the [place]. Case Manager will continue to follow for transition of care needs.     Transition of Care Asessment: Insurance and Status: Insurance coverage has been reviewed Patient has primary care physician: Yes Prior/Current Home Services: No current home services Social Determinants of Health Reivew: SDOH reviewed no interventions necessary Readmission risk has been reviewed: Yes Transition of care needs: transition of care needs identified, TOC will continue to follow

## 2023-06-03 NOTE — Progress Notes (Signed)
eLink Physician-Brief Progress Note Patient Name: Dwayne Brown DOB: July 31, 1941 MRN: [id number]   Date of Service  06/03/2023  HPI/Events of Note  Pt known in Afib but rate has gone to 110-140's  ECHO EF 70%. Camera: Discussed with RN. 40% fio2 on Vent, HR 110 to 130, on levophed.   eICU Interventions  Saline 500 ml bolus , if not better call back.     Intervention Category Intermediate Interventions: Arrhythmia - evaluation and management  Ranee Gosselin 06/03/2023, 3:25 AM

## 2023-06-04 DIAGNOSIS — Z9911 Dependence on respirator [ventilator] status: Secondary | ICD-10-CM | POA: Diagnosis not present

## 2023-06-04 DIAGNOSIS — I469 Cardiac arrest, cause unspecified: Secondary | ICD-10-CM | POA: Diagnosis not present

## 2023-06-04 DIAGNOSIS — G9340 Encephalopathy, unspecified: Secondary | ICD-10-CM

## 2023-06-04 LAB — CBC WITH DIFFERENTIAL/PLATELET
Abs Immature Granulocytes: 0.04 10*3/uL (ref 0.00–0.07)
Basophils Absolute: 0 10*3/uL (ref 0.0–0.1)
Basophils Relative: 0 %
Eosinophils Absolute: 0.1 10*3/uL (ref 0.0–0.5)
Eosinophils Relative: 1 %
HCT: 29.8 % — ABNORMAL LOW (ref 39.0–52.0)
Hemoglobin: 9.5 g/dL — ABNORMAL LOW (ref 13.0–17.0)
Immature Granulocytes: 1 %
Lymphocytes Relative: 7 %
Lymphs Abs: 0.5 10*3/uL — ABNORMAL LOW (ref 0.7–4.0)
MCH: 28.9 pg (ref 26.0–34.0)
MCHC: 31.9 g/dL (ref 30.0–36.0)
MCV: 90.6 fL (ref 80.0–100.0)
Monocytes Absolute: 0.8 10*3/uL (ref 0.1–1.0)
Monocytes Relative: 10 %
Neutro Abs: 6.4 10*3/uL (ref 1.7–7.7)
Neutrophils Relative %: 81 %
Platelets: 112 10*3/uL — ABNORMAL LOW (ref 150–400)
RBC: 3.29 MIL/uL — ABNORMAL LOW (ref 4.22–5.81)
RDW: 16.2 % — ABNORMAL HIGH (ref 11.5–15.5)
WBC: 7.9 10*3/uL (ref 4.0–10.5)
nRBC: 0 % (ref 0.0–0.2)

## 2023-06-04 LAB — APTT
aPTT: 64 s — ABNORMAL HIGH (ref 24–36)
aPTT: 69 s — ABNORMAL HIGH (ref 24–36)

## 2023-06-04 LAB — GLUCOSE, CAPILLARY
Glucose-Capillary: 175 mg/dL — ABNORMAL HIGH (ref 70–99)
Glucose-Capillary: 184 mg/dL — ABNORMAL HIGH (ref 70–99)
Glucose-Capillary: 189 mg/dL — ABNORMAL HIGH (ref 70–99)
Glucose-Capillary: 208 mg/dL — ABNORMAL HIGH (ref 70–99)
Glucose-Capillary: 224 mg/dL — ABNORMAL HIGH (ref 70–99)

## 2023-06-04 LAB — COMPREHENSIVE METABOLIC PANEL
ALT: 12 U/L (ref 0–44)
AST: 26 U/L (ref 15–41)
Albumin: 2.7 g/dL — ABNORMAL LOW (ref 3.5–5.0)
Alkaline Phosphatase: 105 U/L (ref 38–126)
Anion gap: 11 (ref 5–15)
BUN: 19 mg/dL (ref 8–23)
CO2: 20 mmol/L — ABNORMAL LOW (ref 22–32)
Calcium: 8.2 mg/dL — ABNORMAL LOW (ref 8.9–10.3)
Chloride: 104 mmol/L (ref 98–111)
Creatinine, Ser: 1.55 mg/dL — ABNORMAL HIGH (ref 0.61–1.24)
GFR, Estimated: 45 mL/min — ABNORMAL LOW (ref >60)
Glucose, Bld: 192 mg/dL — ABNORMAL HIGH (ref 70–99)
Potassium: 4.5 mmol/L (ref 3.5–5.1)
Sodium: 135 mmol/L (ref 135–145)
Total Bilirubin: 0.6 mg/dL (ref 0.3–1.2)
Total Protein: 5.3 g/dL — ABNORMAL LOW (ref 6.5–8.1)

## 2023-06-04 LAB — AMMONIA: Ammonia: 25 umol/L (ref 9–35)

## 2023-06-04 LAB — PHOSPHORUS: Phosphorus: 3.4 mg/dL (ref 2.5–4.6)

## 2023-06-04 LAB — LACTIC ACID, PLASMA: Lactic Acid, Venous: 1.3 mmol/L (ref 0.5–1.9)

## 2023-06-04 LAB — HEPARIN LEVEL (UNFRACTIONATED): Heparin Unfractionated: 0.62 [IU]/mL (ref 0.30–0.70)

## 2023-06-04 LAB — MAGNESIUM: Magnesium: 1.8 mg/dL (ref 1.7–2.4)

## 2023-06-04 MED ORDER — GLYCOPYRROLATE 0.2 MG/ML IJ SOLN
0.2000 mg | INTRAMUSCULAR | Status: DC | PRN
  Administered 2023-06-04 (×2): 0.2 mg via INTRAVENOUS
  Filled 2023-06-04 (×2): qty 1

## 2023-06-04 MED ORDER — INSULIN GLARGINE-YFGN 100 UNIT/ML ~~LOC~~ SOLN
5.0000 [IU] | Freq: Every day | SUBCUTANEOUS | Status: DC
  Administered 2023-06-04: 5 [IU] via SUBCUTANEOUS
  Filled 2023-06-04: qty 0.05

## 2023-06-04 MED ORDER — GLYCOPYRROLATE 0.2 MG/ML IJ SOLN: 0.2000 mg | INTRAMUSCULAR | Status: DC | PRN

## 2023-06-04 MED ORDER — POLYVINYL ALCOHOL 1.4 % OP SOLN: 1.0000 [drp] | Freq: Four times a day (QID) | OPHTHALMIC | Status: DC | PRN

## 2023-06-04 MED ORDER — FENTANYL BOLUS VIA INFUSION
100.0000 ug | INTRAVENOUS | Status: DC | PRN
  Administered 2023-06-04 (×3): 100 ug via INTRAVENOUS

## 2023-06-04 MED ORDER — ACETAMINOPHEN 650 MG RE SUPP: 650.0000 mg | Freq: Four times a day (QID) | RECTAL | Status: DC | PRN

## 2023-06-04 MED ORDER — DIPHENHYDRAMINE HCL 50 MG/ML IJ SOLN: 25.0000 mg | INTRAMUSCULAR | Status: DC | PRN

## 2023-06-04 MED ORDER — ACETAMINOPHEN 325 MG PO TABS: 650.0000 mg | ORAL_TABLET | Freq: Four times a day (QID) | ORAL | Status: DC | PRN

## 2023-06-04 MED ORDER — MIDAZOLAM HCL 2 MG/2ML IJ SOLN
2.0000 mg | INTRAMUSCULAR | Status: DC | PRN
  Administered 2023-06-04 (×2): 4 mg via INTRAVENOUS
  Filled 2023-06-04 (×2): qty 4

## 2023-06-04 MED ORDER — FENTANYL 2500MCG IN NS 250ML (10MCG/ML) PREMIX INFUSION
0.0000 ug/h | INTRAVENOUS | Status: DC
  Administered 2023-06-04: 50 ug/h via INTRAVENOUS
  Filled 2023-06-04: qty 250

## 2023-06-04 MED ORDER — MAGNESIUM SULFATE 2 GM/50ML IV SOLN
2.0000 g | Freq: Once | INTRAVENOUS | Status: AC
  Administered 2023-06-04: 2 g via INTRAVENOUS
  Filled 2023-06-04: qty 50

## 2023-06-04 MED ORDER — HALOPERIDOL LACTATE 5 MG/ML IJ SOLN
2.5000 mg | INTRAMUSCULAR | Status: DC | PRN
  Administered 2023-06-04: 5 mg via INTRAVENOUS
  Filled 2023-06-04 (×2): qty 1

## 2023-06-04 MED ORDER — GLYCOPYRROLATE 1 MG PO TABS: 1.0000 mg | ORAL_TABLET | ORAL | Status: DC | PRN

## 2023-06-06 LAB — CULTURE, BLOOD (ROUTINE X 2)
Culture: NO GROWTH
Culture: NO GROWTH
Special Requests: ADEQUATE
Special Requests: ADEQUATE

## 2023-06-08 LAB — CULTURE, RESPIRATORY W GRAM STAIN

## 2023-06-21 NOTE — Progress Notes (Signed)
ANTICOAGULATION CONSULT NOTE  Pharmacy Consult for Heparin Indication: atrial fibrillation  No Known Allergies  Patient Measurements: Height: 5\' 10"  (177.8 cm) Weight: 114.4 kg (252 lb 3.3 oz) IBW/kg (Calculated) : 73 Heparin Dosing Weight: 97.4 kg  Vital Signs: Temp: 99.3 F (37.4 C) (10/15 0700) Temp Source: Esophageal (10/14 2000) BP: 108/59 (10/15 0700) Pulse Rate: 58 (10/15 0700)  Labs: Recent Labs    26-Jun-2023 1915 06/26/2023 2032 06/26/23 2221 06/02/23 0305 06/02/23 0618 06/02/23 1738 06/02/23 2027 06/03/23 0516 05/25/2023 0303  HGB  --   --   --    < > 10.8*  --   --  10.2* 9.5*  HCT  --   --   --    < > 33.6*  --   --  32.8* 29.8*  PLT  --   --   --   --  156  --   --  138* 112*  APTT 28  --   --   --  105*  --   --  100* 64*  LABPROT 16.0*  --   --   --   --   --   --   --   --   INR 1.3*  --   --   --   --   --   --   --   --   HEPARINUNFRC  --    < >  --   --  >1.10*  --   --  >1.10* 0.62  CREATININE  --   --   --   --  1.60*  --   --  1.86* 1.55*  TROPONINIHS 155*   < > 282*  --   --  154* 145*  --   --    < > = values in this interval not displayed.    Estimated Creatinine Clearance: 47.4 mL/min (A) (by C-G formula based on SCr of 1.55 mg/dL (H)).   Medical History: Past Medical History:  Diagnosis Date   CAD (coronary artery disease) of bypass graft    Cancer (HCC)    Diabetes mellitus without complication (HCC)    Essential hypertension    High cholesterol     Medications:  Medications Prior to Admission  Medication Sig Dispense Refill Last Dose   allopurinol (ZYLOPRIM) 300 MG tablet Take 300 mg by mouth daily.   Jun 26, 2023   apixaban (ELIQUIS) 5 MG TABS tablet Take 2.5 mg by mouth 2 (two) times daily.   June 26, 2023 at 0900   finasteride (PROSCAR) 5 MG tablet Take 5 mg by mouth daily.   2023/06/26   gabapentin (NEURONTIN) 300 MG capsule Take 1 capsule (300 mg total) by mouth 3 (three) times daily. (Patient taking differently: Take 300 mg by  mouth 2 (two) times daily.) 21 capsule 0 2023-06-26   glipiZIDE (GLUCOTROL) 5 MG tablet Take 2.5 mg by mouth daily.   June 26, 2023   insulin glargine-yfgn (SEMGLEE, YFGN,) 100 UNIT/ML Pen Inject 57 Units into the skin at bedtime.   05/31/2023   ketoconazole (NIZORAL) 2 % shampoo Apply 1 Application topically daily as needed for irritation.   UNK   loperamide (IMODIUM) 2 MG capsule Take 2 mg by mouth 2 (two) times daily.   UNK   metoprolol succinate (TOPROL-XL) 100 MG 24 hr tablet Take 50 mg by mouth 2 (two) times daily. Take with or immediately following a meal.   Jun 26, 2023   nitroGLYCERIN (NITROSTAT) 0.4 MG SL tablet Place 0.4 mg under the tongue every  5 (five) minutes x 3 doses as needed for chest pain.   NEVER   pantoprazole (PROTONIX) 40 MG tablet Take 40 mg by mouth daily before breakfast.   02-Jun-2023   Polyvinyl Alcohol-Povidone (REFRESH OP) Place 1 drop into both eyes daily as needed (dry eyes).   UNK   simvastatin (ZOCOR) 20 MG tablet Take 20 mg by mouth at bedtime.    05/31/2023   tamsulosin (FLOMAX) 0.4 MG CAPS capsule Take 0.4 mg by mouth at bedtime.   05/31/2023   triamcinolone (KENALOG) 0.025 % cream Apply 1 Application topically 2 (two) times daily as needed (atopic dermatitis).   UNK   Scheduled:   allopurinol  300 mg Oral Daily   aspirin  81 mg Per Tube Daily   Chlorhexidine Gluconate Cloth  6 each Topical Q0600   docusate  100 mg Per Tube BID   famotidine  20 mg Per Tube BID   feeding supplement (PROSource TF20)  60 mL Per Tube Daily   feeding supplement (VITAL 1.5 CAL)  1,000 mL Per Tube Q24H   insulin aspart  0-9 Units Subcutaneous Q4H   mouth rinse  15 mL Mouth Rinse Q2H   polyethylene glycol  17 g Per Tube Daily   polyvinyl alcohol  1 drop Both Eyes Q6H   simvastatin  20 mg Oral QHS   Infusions:   cefTRIAXone (ROCEPHIN)  IV Stopped (06/03/23 2133)   fentaNYL infusion INTRAVENOUS Stopped (06/12/2023 0631)   heparin 1,300 Units/hr (05/22/2023 0700)   norepinephrine  (LEVOPHED) Adult infusion Stopped (05/31/2023 0611)   propofol (DIPRIVAN) infusion 10 mcg/kg/min (06/11/2023 0700)   PRN: acetaminophen **OR** acetaminophen (TYLENOL) oral liquid 160 mg/5 mL **OR** acetaminophen, docusate, fentaNYL, fentaNYL (SUBLIMAZE) injection, fentaNYL (SUBLIMAZE) injection, ondansetron (ZOFRAN) IV, mouth rinse, polyethylene glycol  Assessment: 82 yom with a history of DM, HTN, HLD, CAD, CKD. Patient is presenting as a post-arrest. Heparin per pharmacy consult placed for atrial fibrillation.  Patient is reportedly on apixaban prior to arrival though medication history is not completed but Xa level elevated as would be in pta apixaban use  Heparin level continues to be falsely elevated as expected with recent DOAC.  APTT down to just below goal this AM.  No overt bleeding or complications noted.  Goal of Therapy:  Heparin level 0.3-0.7 units/ml aPTT 66-102 seconds Monitor platelets by anticoagulation protocol: Yes   Plan:  Increase IV heparin infusion to 1400 units/hr. Recheck aPTT in 8 hrs. Continue to monitor via aPTT, cbc daily  Continue to monitor s/s bleeding   Reece Leader, Colon Flattery, [facility] Clinical Pharmacist  06/10/2023 7:39 AM   [facility] pharmacy phone numbers are listed on [email or web address]

## 2023-06-21 NOTE — IPAL (Signed)
Interdisciplinary Goals of Care Family Meeting   Date carried out: 06/15/2023  Location of the meeting: Bedside  Member's involved: Physician, Bedside Registered Nurse, and Family Member or next of kin  Durable Power of Attorney or acting medical decision maker: wife    Discussion: We discussed goals of care for [employer] .  I met with Ms. Heinbaugh and her sister to discuss Mr. Parsell's care. She understands that his lack of waking up and missing some of his reflexes is concerning for a poor recovery. We discussed getting an MRI. His children live in [place], and she is planning on calling them to let them know his prognosis is poor. She indicated he was always very lively and would not want to life dependent on life support and full nursing care. Will update her after MRI results.   Code status:   Code Status: Full Code   Disposition: Continue current acute care  Time spent for the meeting: 15 min.     Steffanie Dunn, DO  06-15-2023, 1:36 PM

## 2023-06-21 NOTE — Progress Notes (Signed)
NAME:  Dwayne Brown, MRN:  [id number], DOB:  [birth year], LOS: 3 ADMISSION DATE:  05/26/2023, CONSULTATION DATE:  06/12/2023 REFERRING MD: Lyman Speller, MD , CHIEF COMPLAINT: cardiac arrest   History of Present Illness:  An 82 year old male with CAD (CABG x5, 2002), Afib on NOAC, CKD-3a, HTN, dyslipidemia, IDDM, anemia of chronic illness, and obesity (BMI 35), who presented to ED with EMS as postarrest. Patient was a witnessed arrest at home by his wife. He fell at 4 pm and she started chest compression immediately till EMS arrived after 10 min. Between bystander CPR and EMS CPR, total estimated downtime was 10 minutes. EMS reported that there was a PEA rhythm, not shockable. They administered 2 rounds of epinephrine and did get ROSC. EMS team then started pacing as he had an intrinsic rhythm in the 50s (slow Afib). Intubated with ETT 7.5 @ 27 cm at the teeth on PRVC 18/580/+5/60%. On Epi drip 5 mcg/min and fentanyl drip 50 mcg/hr.  Quit smoking 40 yrs ago. No alcohol or illicit drug use. EKG showed RBBB and Afib without RVR, no ischemic changes. Wife denied that he had f/c/r, CP, SOB, cough, wheezing, URT symptoms, palpitations, dizziness, N/V/D, abd pain, rash, or recent symptoms.     Pertinent  Medical History  CAD (CABG x5, 2002), Afib on NOAC, CKD-3a, HTN, dyslipidemia, IDDM, anemia of chronic illness, obesity (BMI 35)  Significant Hospital Events: Including procedures, antibiotic start and stop dates in addition to other pertinent events   10/12 - intubated.  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 Tip of the endotracheal tube was abutting the anterior tracheal wall. The tube was retracted 2 cm to clear the obstruction. Patient has enlarged pulmonary artery on CT scan which is likely cause of this deformity.   Interim History / Subjective:  Off fentanyl, off propofol this morning.  Weaned off NE this morning.   Objective   Blood pressure (!) 106/58, pulse (!) 59, temperature 98.8 F (37.1 C), resp. rate 20, height 5\' 10"  (1.778 m), weight 114.4 kg, SpO2 99%.    Vent Mode: PRVC FiO2 (%):  [40 %-50 %] 40 % Set Rate:  [20 bmp] 20 bmp Vt Set:  [580 mL] 580 mL PEEP:  [5 cmH20] 5 cmH20 Plateau Pressure:  [24 cmH20-26 cmH20] 24 cmH20   Intake/Output Summary (Last 24 hours) at 06/30/2023 1231 Last data filed at June 30, 2023 1000 Gross per 24 hour  Intake 1860.49 ml  Output 965 ml  Net 895.49 ml   Filed Weights   06/02/23 0445 06/03/23 0500 06-30-2023 0424  Weight: 118 kg 114.1 kg 114.4 kg    Examination: General: critically ill appearing man lying in bed intubated, not sedated HENT: [place]/AT, eyes anicteric, ETT in place Neuro:Pupils sluggishly reaction, + corneals, weak dolls eyes. No gag, + cough- delayed 1-2 seconds. Only minimal w/d from RLE with nailed pressure- no response in other extremities or to trapezius squeeze. No response to verbal stimulation. Lungs: mild rhonchi, thin secretions from ETT Cardiovascular: S1S2, RRR Abdomen: soft, NT Extremities: mild edema  I/O + 900cc, net +5L for admission  Bicarb 20 BUN 19 Cr 1.55 WBC 7.9 H/H 9.5/29.8 Platelets 112 Trach aspirate culture: abundant WBC & GNR >   Resolved problem list:    Assessment & Plan:   Cardiac arrest, PEA NSTEMI -supportive care -con't aspirin, statin -not a candidate for Bblockers while needing inotropic support -not a candidate for invasive  cardiac evaluation unless he has neurological recovery  Paroxysmal A-fib, intermittent RVR.  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 See ipal note.   Best Practice (right click and "Reselect all SmartList Selections" daily)   Diet/type: Initiate tube feeds at trickle DVT prophylaxis: systemic heparin  GI prophylaxis: H2B Lines: N/A Foley:  Yes, and it is still needed Code Status:  full code Last date of multidisciplinary goals of care discussion [family updated at the bedside, they are aware that prognosis may not be favorable..]  This patient is critically ill with multiple organ system failure which requires frequent high complexity decision making, assessment, support, evaluation, and titration of therapies. This was completed through the application of advanced monitoring technologies and extensive interpretation of multiple databases. During this encounter critical care time was devoted to patient care services described in this note for 48 minutes.  Steffanie Dunn, DO 06/09/2023 1:34 PM [facility]  For contact information, see Amion. If no response to pager, please call PCCM consult pager. After hours, 7PM- 7AM,  please call Elink.

## 2023-06-21 NOTE — Death Summary Note (Signed)
DEATH SUMMARY   Patient Details  Name: Dwayne Brown MRN: [id number] DOB: 12/24/1940  Admission/Discharge Information   Admit Date:  Jun 12, 2023  Date of Death: Date of Death: 2023-06-15  Time of Death: Time of Death: 2131/07/21  Length of Stay: 3  Referring Physician: [facility], [facility]   Reason(s) for Hospitalization  Cardiac arrest- PEA, due to NSTEMI  Diagnoses  Preliminary cause of death:  Secondary Diagnoses (including complications and co-morbidities):  Principal Problem:   Cardiac arrest [facility]) Active Problems:   Acute hypoxic respiratory failure (HCC)   AKI (acute kidney injury) (HCC) Stenotrophomonas maltophilia aspiration pneumonia GNR pneumonia Anoxic brain injury NSTEMI Paroxysmal Afib with RVR Lactic acidosis CKD 3B Oliguric AKI Hyperglycemia Diabetes GERD BPH Mild aortic stenosis Gout History of HTN History of HLD  Brief Hospital Course (including significant findings, care, treatment, and services provided and events leading to death)  [name] is a 82 y.o. year old male  with CAD (CABG x5, July 20, 2001), Afib on NOAC, CKD-3a, HTN, dyslipidemia, IDDM, anemia of chronic illness, and obesity (BMI 35), who presented to ED with EMS as postarrest. Patient was a witnessed arrest at home by his wife. He fell at 4 pm and she started chest compression immediately till EMS arrived after 10 min. Between bystander CPR and EMS CPR, total estimated downtime was 10 minutes. EMS reported that there was a PEA rhythm, not shockable. They administered 2 rounds of epinephrine and did get ROSC. EMS team then started pacing as he had an intrinsic rhythm in the 50s (slow Afib). Intubated with ETT 7.5 @ 27 cm at the teeth on PRVC 18/580/+5/60%. On Epi drip 5 mcg/min and fentanyl drip 50 mcg/hr.  Quit smoking 40 yrs ago. No alcohol or illicit drug use. EKG showed RBBB and Afib without RVR, no ischemic changes. Wife denied that he had f/c/r, CP, SOB, cough, wheezing, URT symptoms,  palpitations, dizziness, N/V/D, abd pain, rash, or recent symptoms.   Mr. Warters was supported with antibiotics, mechanical ventilation, vasopressors, enteral nutrition. He failed to regain consciousness and brain MRI confirmed a severe anoxic brain injury. His family confirmed he would not want aggressive care if he would not recover. He was compassionately extubated with family at bedside.   Pertinent Labs and Studies  Significant Diagnostic Studies DG Abd Portable 1V  Result Date: 06/03/2023 CLINICAL DATA:  Feeding tube placement. EXAM: PORTABLE ABDOMEN - 1 VIEW COMPARISON:  CT 12-Jun-2023 FINDINGS: Tip of the enteric tube is below the diaphragm. The side port is likely below the diaphragm generalized paucity of upper abdominal bowel gas. IMPRESSION: Tip of the enteric tube below the diaphragm. Electronically Signed   By: Narda Rutherford M.D.   On: 06/03/2023 18:48   ECHOCARDIOGRAM COMPLETE  Result Date: 06/02/2023    ECHOCARDIOGRAM REPORT   Patient Name:   Dwayne Brown Date of Exam: 06/02/2023 Medical Rec #:  [id number]        Height:       70.0 in Accession #:    [id number]       Weight:       260.1 lb Date of Birth:  [birth year]        BSA:          2.334 m Patient Age:    82 years         BP:           119/66 mmHg Patient Gender: M  HR:           112 bpm. Exam Location:  Inpatient Procedure: 2D Echo, Color Doppler, Cardiac Doppler and Intracardiac            Opacification Agent Indications:    Cardiac Arrest  History:        Patient has prior history of Echocardiogram examinations, most                 recent 11/22/2015. CAD, CKD, Arrythmias:Cardiac Arrest; Risk                 Factors:Dyslipidemia and Diabetes.  Sonographer:    Milbert Coulter Referring Phys: Lidia Collum  Sonographer Comments: Suboptimal parasternal window, suboptimal apical window, no subcostal window, echo performed with patient supine and on artificial respirator and patient is obese. Image acquisition  challenging due to patient body habitus and Image acquisition challenging due to respiratory motion. IMPRESSIONS  1. Left ventricular ejection fraction, by estimation, is 70 to 75%. The left ventricle has hyperdynamic function. The left ventricle has no regional wall motion abnormalities. Left ventricular diastolic function could not be evaluated.  2. Right ventricular systolic function is normal. The right ventricular size is normal.  3. Left atrial size was mildly dilated.  4. The mitral valve was not well visualized. No evidence of mitral valve regurgitation. No evidence of mitral stenosis.  5. The aortic valve is tricuspid. There is mild calcification of the aortic valve. Aortic valve regurgitation is not visualized. Mild aortic valve stenosis. Aortic valve area, by VTI measures 1.67 cm. Aortic valve mean gradient measures 10.0 mmHg. Aortic valve Vmax measures 2.21 m/s. Conclusion(s)/Recommendation(s): Technically limited study due to poor soundwave transmission. FINDINGS  Left Ventricle: Left ventricular ejection fraction, by estimation, is 70 to 75%. The left ventricle has hyperdynamic function. The left ventricle has no regional wall motion abnormalities. Definity contrast agent was given IV to delineate the left ventricular endocardial borders. The left ventricular internal cavity size was normal in size. There is no left ventricular hypertrophy. Left ventricular diastolic function could not be evaluated due to atrial fibrillation. Left ventricular diastolic function could not be evaluated. Right Ventricle: The right ventricular size is normal. No increase in right ventricular wall thickness. Right ventricular systolic function is normal. Left Atrium: Left atrial size was mildly dilated. Right Atrium: Right atrial size was not well visualized. Pericardium: There is no evidence of pericardial effusion. Mitral Valve: The mitral valve was not well visualized. Mild mitral annular calcification. No evidence of  mitral valve regurgitation. No evidence of mitral valve stenosis. Tricuspid Valve: The tricuspid valve is grossly normal. Tricuspid valve regurgitation is trivial. No evidence of tricuspid stenosis. Aortic Valve: The aortic valve is tricuspid. There is mild calcification of the aortic valve. Aortic valve regurgitation is not visualized. Mild aortic stenosis is present. Aortic valve mean gradient measures 10.0 mmHg. Aortic valve peak gradient measures 19.5 mmHg. Aortic valve area, by VTI measures 1.67 cm. Pulmonic Valve: The pulmonic valve was not well visualized. Pulmonic valve regurgitation is not visualized. No evidence of pulmonic stenosis. Aorta: The aortic root is normal in size and structure. Venous: The inferior vena cava was not well visualized. IAS/Shunts: No atrial level shunt detected by color flow Doppler.  LEFT VENTRICLE PLAX 2D LVIDd:         3.60 cm   Diastology LVIDs:         2.30 cm   LV e' medial:    6.96 cm/s LV PW:  1.00 cm   LV E/e' medial:  14.8 LV IVS:        1.10 cm   LV e' lateral:   8.27 cm/s LVOT diam:     1.80 cm   LV E/e' lateral: 12.5 LV SV:         48 LV SV Index:   20 LVOT Area:     2.54 cm  RIGHT VENTRICLE RV S prime:     11.20 cm/s TAPSE (M-mode): 1.8 cm LEFT ATRIUM         Index LA diam:    3.00 cm 1.29 cm/m  AORTIC VALVE AV Area (Vmax):    1.45 cm AV Area (Vmean):   1.44 cm AV Area (VTI):     1.67 cm AV Vmax:           221.00 cm/s AV Vmean:          145.000 cm/s AV VTI:            0.287 m AV Peak Grad:      19.5 mmHg AV Mean Grad:      10.0 mmHg LVOT Vmax:         126.00 cm/s LVOT Vmean:        82.100 cm/s LVOT VTI:          0.188 m LVOT/AV VTI ratio: 0.66  AORTA Ao Root diam: 3.00 cm MITRAL VALVE MV Area (PHT): 3.74 cm     SHUNTS MV Decel Time: 203 msec     Systemic VTI:  0.19 m MV E velocity: 103.00 cm/s  Systemic Diam: 1.80 cm MV A velocity: 66.30 cm/s MV E/A ratio:  1.55 Arvilla Meres MD Electronically signed by Arvilla Meres MD Signature Date/Time:  06/02/2023/7:42:09 PM    Final    EEG adult  Result Date: 06/02/2023 Charlsie Quest, MD     06/02/2023  9:15 AM Patient Name: RAYMERE FRAN MRN: [id number] Epilepsy Attending: Charlsie Quest Referring Physician/Provider: Lidia Collum, PA-C Date: 06/02/2023 Duration: 24.08 mins Patient history: 82yo M s/p cardiac arrest getting eeg to evaluate for seizure. Level of alertness: comatose AEDs during EEG study: Propofol Technical aspects: This EEG study was done with scalp electrodes positioned according to the 10-20 International system of electrode placement. Electrical activity was reviewed with band pass filter of 1-70Hz , sensitivity of 7 uV/mm, display speed of 69mm/sec with a 60Hz  notched filter applied as appropriate. EEG data were recorded continuously and digitally stored.  Video monitoring was available and reviewed as appropriate. Description: EEG showed continuous generalized background suppression, not reactive to stimulation. Hyperventilation and photic stimulation were not performed.   ABNORMALITY - Background suppression, generalized IMPRESSION: This study is suggestive of profound diffuse encephalopathy. No seizures or epileptiform discharges were seen throughout the recording. Charlsie Quest   DG Chest Portable 1 View  Addendum Date: 06/02/2023   ADDENDUM REPORT: 06/02/2023 01:50 ADDENDUM: These results were called by telephone at the time of interpretation on 2023/06/05 at 7:07 p.m. to provider [facility] , who verbally acknowledged these results. Electronically Signed   By: Darliss Cheney M.D.   On: 06/02/2023 01:50   Result Date: 06/02/2023 CLINICAL DATA:  Intubation EXAM: PORTABLE CHEST 1 VIEW COMPARISON:  Chest x-ray 11/20/2015 FINDINGS: The endotracheal tube tip is high positioned 7.3 cm above the carina. Enteric tube tip is in the distal esophagus. Sternotomy wires are again noted. The cardiac silhouette is enlarged, unchanged. There central pulmonary vascular  congestion. No focal lung infiltrate, pleural  effusion or pneumothorax identified. No acute fractures are seen. IMPRESSION: 1. The endotracheal tube tip is high positioned 7.3 cm above the carina. Consider repositioning. 2. Enteric tube tip is in the distal esophagus. Recommend advancement. Electronically Signed: By: Darliss Cheney M.D. On: 06/12/2023 19:05   CT Head Wo Contrast  Result Date: 06/18/2023 CLINICAL DATA:  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 EXAM: CT HEAD WITHOUT CONTRAST TECHNIQUE: Contiguous axial images were obtained from the base of the skull through the vertex without intravenous contrast. RADIATION DOSE REDUCTION: This exam was performed according to the departmental dose-optimization program which includes automated exposure control, adjustment of the mA and/or kV according to patient size and/or use of iterative reconstruction technique. COMPARISON:  None Available. FINDINGS: Brain: No acute hemorrhage. Cortical gray-white differentiation is preserved. Age indeterminate perforator infarcts in the right caudate nucleus and right thalamus. Prominence of the ventricles and sulci within normal limits for age. No extra-axial collection. Basilar cisterns are patent. Vascular: No hyperdense vessel or unexpected calcification. Skull: No calvarial fracture or suspicious bone lesion. Skull base is unremarkable. Sinuses/Orbits: No acute finding. Other: None. IMPRESSION: 1. No acute intracranial hemorrhage. No evidence of anoxic brain injury at this time. 2. Age indeterminate perforator infarcts in the right caudate nucleus and right thalamus. Electronically Signed   By: Orvan Falconer M.D.   On: 06/07/2023 19:53   CT Angio Chest PE W and/or Wo Contrast  Result Date: 05/25/2023 CLINICAL DATA:  Cardiac arrest, history of colon cancer * Tracking Code: BO * EXAM: CT ANGIOGRAPHY CHEST WITH CONTRAST CT ABDOMEN PELVIS WITH CONTRAST TECHNIQUE: Multidetector CT imaging of the chest was  performed using the standard protocol during bolus administration of intravenous contrast. Multiplanar CT image reconstructions and MIPs were obtained to evaluate the vascular anatomy. Multidetector CT imaging of the abdomen and pelvis was performed using the standard protocol following bolus administration of intravenous contrast. RADIATION DOSE REDUCTION: This exam was performed according to the departmental dose-optimization program which includes automated exposure control, adjustment of the mA and/or kV according to patient size and/or use of iterative reconstruction technique. CONTRAST:  60mL OMNIPAQUE IOHEXOL 350 MG/ML SOLN COMPARISON:  CT chest angiogram, 04/18/2014 FINDINGS: CT CHEST ANGIOGRAM FINDINGS Cardiovascular: Satisfactory opacification of the pulmonary arteries to the segmental level. No evidence of pulmonary embolism. Cardiomegaly. Three-vessel coronary artery calcifications status post median sternotomy and CABG. Enlargement of the main pulmonary artery measuring up to 3.8 cm in caliber. No pericardial effusion. Aortic atherosclerosis. Mediastinum/Nodes: No enlarged mediastinal, hilar, or axillary lymph nodes. Thyroid gland, trachea, and esophagus demonstrate no significant findings. Lungs/Pleura: Endotracheal intubation, tube tip above the carina. Bibasilar atelectasis or consolidation with trace pleural effusions. Musculoskeletal: No chest wall abnormality. Nondisplaced fractures of the anterior ribs, presumably secondary to CPR (series 4, image 97) Review of the MIP images confirms the above findings. CT ABDOMEN PELVIS FINDINGS Hepatobiliary: Coarse, nodular cirrhotic morphology of the liver. No focal liver abnormality is seen. Status post cholecystectomy. No biliary dilatation. Pancreas: Unremarkable. No pancreatic ductal dilatation or surrounding inflammatory changes. Spleen: Normal in size without significant abnormality. Adrenals/Urinary Tract: Adrenal glands are unremarkable. Kidneys are  normal, without renal calculi, solid lesion, or hydronephrosis. Foley catheter in the bladder. Stomach/Bowel: Stomach is within normal limits. Status post right hemicolectomy and reanastomosis. No evidence of bowel wall thickening, distention, or inflammatory changes. Vascular/Lymphatic: Aortic atherosclerosis. 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 No enlarged abdominal or pelvic lymph nodes. Reproductive: No mass or other significant abnormality. Other: Small fat containing bilateral inguinal hernias. Large volume ascites throughout the abdomen and pelvis. Musculoskeletal: No acute or significant osseous findings. IMPRESSION: 1. Negative examination for pulmonary embolism. 2. Bibasilar atelectasis or consolidation with trace pleural effusions. 3. Cardiomegaly and coronary artery disease status post median sternotomy and CABG. 4. Enlargement of the main pulmonary artery, as can be seen in pulmonary arterial hypertension. 5. Cirrhosis and large volume ascites throughout the abdomen and pelvis. 6. Status post right hemicolectomy and reanastomosis. No evidence of lymphadenopathy or metastatic disease in the abdomen or pelvis. 7. 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 Recommend referral to or continued care with vascular specialist on a nonemergent, outpatient basis, if and when clinically appropriate. (Ref.: J Vasc Surg. 2018; 67:2-77 and J Am Coll Radiol 2013;10(10):789-794.) Aortic Atherosclerosis (ICD10-I70.0). Electronically Signed   By: Jearld Lesch M.D.   On: 05/27/2023 19:44   CT ABDOMEN PELVIS W CONTRAST  Result Date:  06/12/2023 CLINICAL DATA:  Cardiac arrest, history of colon cancer * Tracking Code: BO * EXAM: CT ANGIOGRAPHY CHEST WITH CONTRAST CT ABDOMEN PELVIS WITH CONTRAST TECHNIQUE: Multidetector CT imaging of the chest was performed using the standard protocol during bolus administration of intravenous contrast. Multiplanar CT image reconstructions and MIPs were obtained to evaluate the vascular anatomy. Multidetector CT imaging of the abdomen and pelvis was performed using the standard protocol following bolus administration of intravenous contrast. RADIATION DOSE REDUCTION: This exam was performed according to the departmental dose-optimization program which includes automated exposure control, adjustment of the mA and/or kV according to patient size and/or use of iterative reconstruction technique. CONTRAST:  60mL OMNIPAQUE IOHEXOL 350 MG/ML SOLN COMPARISON:  CT chest angiogram, 04/18/2014 FINDINGS: CT CHEST ANGIOGRAM FINDINGS Cardiovascular: Satisfactory opacification of the pulmonary arteries to the segmental level. No evidence of pulmonary embolism. Cardiomegaly. Three-vessel coronary artery calcifications status post median sternotomy and CABG. Enlargement of the main pulmonary artery measuring up to 3.8 cm in caliber. No pericardial effusion. Aortic atherosclerosis. Mediastinum/Nodes: No enlarged mediastinal, hilar, or axillary lymph nodes. Thyroid gland, trachea, and esophagus demonstrate no significant findings. Lungs/Pleura: Endotracheal intubation, tube tip above the carina. Bibasilar atelectasis or consolidation with trace pleural effusions. Musculoskeletal: No chest wall abnormality. Nondisplaced fractures of the anterior ribs, presumably secondary to CPR (series 4, image 97) Review of the MIP images confirms the above findings. CT ABDOMEN PELVIS FINDINGS Hepatobiliary: Coarse, nodular cirrhotic morphology of the liver. No focal liver abnormality is seen. Status post cholecystectomy. No biliary dilatation.  Pancreas: Unremarkable. No pancreatic ductal dilatation or surrounding inflammatory changes. Spleen: Normal in size without significant abnormality. Adrenals/Urinary Tract: Adrenal glands are unremarkable. Kidneys are normal, without renal calculi, solid lesion, or hydronephrosis. Foley catheter in the bladder. Stomach/Bowel: Stomach is within normal limits. Status post right hemicolectomy and reanastomosis. No evidence of bowel wall thickening, distention, or inflammatory changes. Vascular/Lymphatic: Aortic atherosclerosis. 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 No enlarged abdominal or pelvic lymph nodes.  Reproductive: No mass or other significant abnormality. Other: Small fat containing bilateral inguinal hernias. Large volume ascites throughout the abdomen and pelvis. Musculoskeletal: No acute or significant osseous findings. IMPRESSION: 1. Negative examination for pulmonary embolism. 2. Bibasilar atelectasis or consolidation with trace pleural effusions. 3. Cardiomegaly and coronary artery disease status post median sternotomy and CABG. 4. Enlargement of the main pulmonary artery, as can be seen in pulmonary arterial hypertension. 5. Cirrhosis and large volume ascites throughout the abdomen and pelvis. 6. Status post right hemicolectomy and reanastomosis. No evidence of lymphadenopathy or metastatic disease in the abdomen or pelvis. 7. 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 Recommend referral to or continued care with vascular specialist on a nonemergent, outpatient basis, if and when clinically appropriate. (Ref.: J Vasc Surg. 2018;  67:2-77 and J Am Coll Radiol 2013;10(10):789-794.) Aortic Atherosclerosis (ICD10-I70.0). Electronically Signed   By: Jearld Lesch M.D.   On: 06/16/2023 19:44   DG Chest Port 1 View  Result Date: 06/07/2023 CLINICAL DATA:  Status post intubation. EXAM: PORTABLE CHEST 1 VIEW COMPARISON:  Earlier today, 5:38 p.m. FINDINGS: 5:43 p.m. Status post median sternotomy and CABG procedure. Stable cardiomediastinal contours. Endotracheal tube has been advanced and is now 3.6 cm above the carina. Lung volumes are low. Asymmetric increase interstitial markings noted within the left mid and left lower lung. Visualized osseous structures are unremarkable. IMPRESSION: 1. Endotracheal tube has been advanced and is now 3.6 cm above the carina. 2. Asymmetric increase interstitial markings within the left mid and left lower lung. Electronically Signed   By: Signa Kell M.D.   On: 06/10/2023 19:05    Microbiology Recent Results (from the past 240 hour(s))  Culture, blood (Routine X 2) w Reflex to ID Panel     Status: None (Preliminary result)   Collection Time: 06/20/2023 10:20 PM   Specimen: BLOOD  Result Value Ref Range Status   Specimen Description BLOOD BLOOD RIGHT HAND  Final   Special Requests   Final    BOTTLES DRAWN AEROBIC AND ANAEROBIC Blood Culture adequate volume   Culture   Final    NO GROWTH 4 DAYS Performed at [facility], [address] [place], [place] [address]    Report Status PENDING  Incomplete  Culture, blood (Routine X 2) w Reflex to ID Panel     Status: None (Preliminary result)   Collection Time: 06/11/2023 10:20 PM   Specimen: BLOOD  Result Value Ref Range Status   Specimen Description BLOOD BLOOD RIGHT HAND  Final   Special Requests   Final    BOTTLES DRAWN AEROBIC AND ANAEROBIC Blood Culture adequate volume   Culture   Final    NO GROWTH 4 DAYS Performed at [facility], [address] [place], [place] [address]    Report Status PENDING  Incomplete  MRSA Next Gen  by PCR, Nasal     Status: None   Collection Time: 06/19/2023 10:50 PM   Specimen: Nasal Mucosa; Nasal Swab  Result Value Ref Range Status   MRSA by PCR Next Gen NOT DETECTED NOT DETECTED Final    Comment: (NOTE) The GeneXpert MRSA Assay (FDA approved for NASAL specimens only), is one component of a comprehensive MRSA colonization surveillance program. It is not intended to diagnose MRSA infection nor to guide or monitor treatment  for MRSA infections. Test performance is not FDA approved in patients less than 26 years old. Performed at [facility], [address], [place], [place] [address]   Culture, Respiratory w Gram Stain     Status: None (Preliminary result)   Collection Time: 06/03/23  6:05 PM   Specimen: Tracheal Aspirate; Respiratory  Result Value Ref Range Status   Specimen Description TRACHEAL ASPIRATE  Final   Special Requests NONE  Final   Gram Stain   Final    ABUNDANT WBC PRESENT, PREDOMINANTLY PMN ABUNDANT GRAM NEGATIVE RODS    Culture   Final    ABUNDANT GRAM NEGATIVE RODS ABUNDANT STENOTROPHOMONAS MALTOPHILIA SUSCEPTIBILITIES TO FOLLOW Performed at [facility], [address] [place], [place] [address]    Report Status PENDING  Incomplete    Lab Basic Metabolic Panel: Recent Labs  Lab 06/12/2023 1726 06/12/2023 1726 05/22/2023 1731 06/12/2023 1913 06/02/23 0305 06/02/23 0618 06/02/23 1539 06/03/23 0516 06/03/23 1657 16-Jun-2023 0303  NA 140  --  142 138 137 138  --  137  --  135  K 4.0  --  4.0 3.5 4.8 5.1  --  5.1  --  4.5  CL 106  --  107  --   --  105  --  104  --  104  CO2  --   --  20*  --   --  20*  --  17*  --  20*  GLUCOSE 170*  --  178*  --   --  163*  --  138*  --  192*  BUN 15  --  14  --   --  16  --  20  --  19  CREATININE 1.70*  --  1.81*  --   --  1.60*  --  1.86*  --  1.55*  CALCIUM  --   --  8.3*  --   --  8.2*  --  8.4*  --  8.2*  MG  --    < > 1.7  --   --  1.9 1.7 1.7 1.9 1.8  PHOS  --   --   --   --   --   --  4.0 4.2 3.3  3.4   < > = values in this interval not displayed.   Liver Function Tests: Recent Labs  Lab 05/27/2023 1915 06/02/23 0618 06/03/23 0516 June 16, 2023 0303  AST 32 28 26 26   ALT 15 15 14 12   ALKPHOS 110 111 102 105  BILITOT 0.9 0.8 0.8 0.6  PROT 5.3* 5.3* 5.4* 5.3*  ALBUMIN 2.8* 2.9* 2.9* 2.7*   No results for input(s): "LIPASE", "AMYLASE" in the last 168 hours. Recent Labs  Lab 05/31/2023 2052 Jun 16, 2023 0303  AMMONIA 31 25   CBC: Recent Labs  Lab 05/24/2023 1731 05/28/2023 1913 06/02/23 0305 06/02/23 0618 06/03/23 0516 2023-06-16 0303  WBC 11.8*  --   --  15.1* 12.6* 7.9  NEUTROABS  --   --   --   --  11.0* 6.4  HGB 10.4* 10.5* 10.9* 10.8* 10.2* 9.5*  HCT 33.8* 31.0* 32.0* 33.6* 32.8* 29.8*  MCV 94.2  --   --  92.8 92.9 90.6  PLT 172  --   --  156 138* 112*   Cardiac Enzymes: No results for input(s): "CKTOTAL", "CKMB", "CKMBINDEX", "TROPONINI" in the last 168 hours. Sepsis Labs: Recent Labs  Lab 05/22/2023 1731 05/26/2023 2032 06/14/2023 2318 06/02/23 0618 06/02/23 1738 06/02/23 2027 06/03/23  1610 07/03/23 0303 07/03/23 0936  WBC 11.8*  --   --  15.1*  --   --  12.6* 7.9  --   LATICACIDVEN  --    < > 5.8*  --  2.7* 2.9*  --   --  1.3   < > = values in this interval not displayed.    Procedures/Operations  Bronchoscopy    Steffanie Dunn 06/05/2023, 1:25 PM

## 2023-06-21 NOTE — IPAL (Signed)
Interdisciplinary Goals of Care Family Meeting   Date carried out: 06/14/23  Location of the meeting: Bedside  Member's involved: Physician, Bedside Registered Nurse, and Family Member or next of kin  Durable Power of Attorney or acting medical decision maker: wife    Discussion: We discussed goals of care for [employer] .  I met with wife, sister in law, and the wife's daughter. They indicated he would not want to continue aggressive care measures and family does not want to see him this way, She indicated she is the designated Management consultant. She wants to proceed with comfort-focused care tonight. Comfort orders placed. RN present throughout discussion.  Code status:   Code Status: Full Code   Disposition: In-patient comfort care  Time spent for the meeting: 5 min.    Steffanie Dunn, DO  06/14/23, 5:07 PM

## 2023-06-21 DEATH — deceased
